# Patient Record
Sex: Male | Born: 1980 | Race: White | Hispanic: No | Marital: Single | Smoking: Never smoker
Health system: Southern US, Community
[De-identification: ages and names within clinical notes are randomized; demographics above are authoritative.]

## PROBLEM LIST (undated history)

## (undated) DIAGNOSIS — F4310 Post-traumatic stress disorder, unspecified: Secondary | ICD-10-CM

## (undated) DIAGNOSIS — F322 Major depressive disorder, single episode, severe without psychotic features: Secondary | ICD-10-CM

## (undated) HISTORY — PX: WISDOM TOOTH EXTRACTION: SHX21

---

## 2020-01-25 ENCOUNTER — Other Ambulatory Visit: Payer: Self-pay

## 2020-01-25 ENCOUNTER — Emergency Department
Admission: EM | Admit: 2020-01-25 | Discharge: 2020-01-26 | Disposition: A | Discharge status: Other Acute Inpatient Hospital | Payer: No Typology Code available for payment source | Attending: Student in an Organized Health Care Education/Training Program | Admitting: Student in an Organized Health Care Education/Training Program

## 2020-01-25 ENCOUNTER — Emergency Department: Payer: No Typology Code available for payment source

## 2020-01-25 DIAGNOSIS — S61512A Laceration without foreign body of left wrist, initial encounter: Secondary | ICD-10-CM

## 2020-01-25 DIAGNOSIS — F4312 Post-traumatic stress disorder, chronic: Secondary | ICD-10-CM | POA: Diagnosis present

## 2020-01-25 DIAGNOSIS — Y939 Activity, unspecified: Secondary | ICD-10-CM | POA: Diagnosis not present

## 2020-01-25 DIAGNOSIS — Z880 Allergy status to penicillin: Secondary | ICD-10-CM | POA: Diagnosis not present

## 2020-01-25 DIAGNOSIS — R45851 Suicidal ideations: Secondary | ICD-10-CM | POA: Insufficient documentation

## 2020-01-25 DIAGNOSIS — Y999 Unspecified external cause status: Secondary | ICD-10-CM | POA: Diagnosis not present

## 2020-01-25 DIAGNOSIS — Z23 Encounter for immunization: Secondary | ICD-10-CM | POA: Insufficient documentation

## 2020-01-25 DIAGNOSIS — Z20822 Contact with and (suspected) exposure to covid-19: Secondary | ICD-10-CM | POA: Diagnosis not present

## 2020-01-25 DIAGNOSIS — F329 Major depressive disorder, single episode, unspecified: Secondary | ICD-10-CM | POA: Diagnosis not present

## 2020-01-25 DIAGNOSIS — T1491XA Suicide attempt, initial encounter: Secondary | ICD-10-CM

## 2020-01-25 DIAGNOSIS — W269XXA Contact with unspecified sharp object(s), initial encounter: Secondary | ICD-10-CM | POA: Diagnosis not present

## 2020-01-25 DIAGNOSIS — Y929 Unspecified place or not applicable: Secondary | ICD-10-CM | POA: Insufficient documentation

## 2020-01-25 DIAGNOSIS — F332 Major depressive disorder, recurrent severe without psychotic features: Secondary | ICD-10-CM | POA: Diagnosis present

## 2020-01-25 HISTORY — DX: Major depressive disorder, single episode, severe without psychotic features: F32.2

## 2020-01-25 HISTORY — DX: Post-traumatic stress disorder, unspecified: F43.10

## 2020-01-25 LAB — CBC
HCT: 40.2 % (ref 39.0–52.0)
Hemoglobin: 13.6 g/dL (ref 13.0–17.0)
MCH: 30.4 pg (ref 26.0–34.0)
MCHC: 33.8 g/dL (ref 30.0–36.0)
MCV: 89.7 fL (ref 80.0–100.0)
Platelets: 281 10*3/uL (ref 150–400)
RBC: 4.48 MIL/uL (ref 4.22–5.81)
RDW: 12.4 % (ref 11.5–15.5)
WBC: 5.3 10*3/uL (ref 4.0–10.5)
nRBC: 0 % (ref 0.0–0.2)

## 2020-01-25 LAB — URINE DRUG SCREEN, QUALITATIVE (ARMC ONLY)
Amphetamines, Ur Screen: NOT DETECTED
Barbiturates, Ur Screen: NOT DETECTED
Benzodiazepine, Ur Scrn: NOT DETECTED
Cannabinoid 50 Ng, Ur ~~LOC~~: NOT DETECTED
Cocaine Metabolite,Ur ~~LOC~~: NOT DETECTED
MDMA (Ecstasy)Ur Screen: NOT DETECTED
Methadone Scn, Ur: NOT DETECTED
Opiate, Ur Screen: NOT DETECTED
Phencyclidine (PCP) Ur S: NOT DETECTED
Tricyclic, Ur Screen: NOT DETECTED

## 2020-01-25 LAB — COMPREHENSIVE METABOLIC PANEL
ALT: 19 U/L (ref 0–44)
AST: 24 U/L (ref 15–41)
Albumin: 4.4 g/dL (ref 3.5–5.0)
Alkaline Phosphatase: 87 U/L (ref 38–126)
Anion gap: 11 (ref 5–15)
BUN: 14 mg/dL (ref 6–20)
CO2: 24 mmol/L (ref 22–32)
Calcium: 9.1 mg/dL (ref 8.9–10.3)
Chloride: 105 mmol/L (ref 98–111)
Creatinine, Ser: 0.79 mg/dL (ref 0.61–1.24)
GFR calc Af Amer: >60 mL/min (ref >60)
GFR calc non Af Amer: >60 mL/min (ref >60)
Glucose, Bld: 122 mg/dL — ABNORMAL HIGH (ref 70–99)
Potassium: 3.6 mmol/L (ref 3.5–5.1)
Sodium: 140 mmol/L (ref 135–145)
Total Bilirubin: 1 mg/dL (ref 0.3–1.2)
Total Protein: 7.3 g/dL (ref 6.5–8.1)

## 2020-01-25 LAB — ACETAMINOPHEN LEVEL: Acetaminophen (Tylenol), Serum: <10 ug/mL — ABNORMAL LOW (ref 10–30)

## 2020-01-25 LAB — RESPIRATORY PANEL BY RT PCR (FLU A&B, COVID)
Influenza A by PCR: NEGATIVE
Influenza B by PCR: NEGATIVE
SARS Coronavirus 2 by RT PCR: NEGATIVE

## 2020-01-25 LAB — SALICYLATE LEVEL: Salicylate Lvl: <7 mg/dL — ABNORMAL LOW (ref 7.0–30.0)

## 2020-01-25 LAB — ETHANOL: Alcohol, Ethyl (B): 26 mg/dL — ABNORMAL HIGH (ref <10)

## 2020-01-25 MED ORDER — LIDOCAINE HCL (PF) 1 % IJ SOLN
5.0000 mL | Freq: Once | INTRAMUSCULAR | Status: AC
  Administered 2020-01-25: 22:00:00 5 mL via INTRADERMAL
  Filled 2020-01-25: qty 5

## 2020-01-25 MED ORDER — TETANUS-DIPHTH-ACELL PERTUSSIS 5-2.5-18.5 LF-MCG/0.5 IM SUSP
0.5000 mL | Freq: Once | INTRAMUSCULAR | Status: AC
  Administered 2020-01-25: 22:00:00 0.5 mL via INTRAMUSCULAR
  Filled 2020-01-25: qty 0.5

## 2020-01-25 NOTE — ED Notes (Signed)
Pt. Alert and oriented, warm and dry, in no distress. Pt. Denies SI, HI, and AVH. Pt has self afflicted LAC to left anterior wrist. Cut is 2 inches X 1 inch wide. Bleeding controled at this time.  Pt. Encouraged to let nursing staff know of any concerns or needs.

## 2020-01-25 NOTE — ED Triage Notes (Signed)
Pt to the er in custody of [place] [employer]. Pt states he is here for suicide attempt. Pt has a significant lac to the left anterior wrist that requires stitches. Pt was recently hospitalized at the [place]  From November to last Tuesday. Pt has a flat affect and attempted suicide on Saturday and when he was not successful, he researched better ways to succeed. Pt is monotone and has a flat affect but is very cooperative.

## 2020-01-25 NOTE — ED Provider Notes (Signed)
[facility] Emergency Department Provider Note    First MD Initiated Contact with Patient 01/25/20 2029     (approximate)  I have reviewed the triage vital signs and the nursing notes.   HISTORY  Chief Complaint SI   HPI Joseph Aguilar is a 39 y.o. male with a history of severe depression as well as PTSD presents to the ER for suicidal ideation as well as attempted suicide after cutting himself.  Patient was recently admitted to inpatient psychiatric facility did have some medication changes.  Feels like he is not sleeping.  Does endorse anhedonia.  Did admit to drinking alcohol today.    Past Medical History:  Diagnosis Date  . PTSD (post-traumatic stress disorder)   . Severe depression (HCC)    No family history on file. Past Surgical History:  Procedure Laterality Date  . WISDOM TOOTH EXTRACTION     There are no problems to display for this patient.     Prior to Admission medications   Not on File    Allergies Amoxicillin    Social History Social History   Tobacco Use  . Smoking status: Never Smoker  . Smokeless tobacco: Never Used  Substance Use Topics  . Alcohol use: Yes    Comment: social  . Drug use: Never    Review of Systems Patient denies headaches, rhinorrhea, blurry vision, numbness, shortness of breath, chest pain, edema, cough, abdominal pain, nausea, vomiting, diarrhea, dysuria, fevers, rashes or hallucinations unless otherwise stated above in HPI. ____________________________________________   PHYSICAL EXAM:  VITAL SIGNS: Vitals:   01/25/20 2000  BP: 128/80  Pulse: 89  Resp: 16  Temp: 98.6 F (37 C)  SpO2: 97%    Constitutional: Alert and oriented.  Eyes: Conjunctivae are normal.  Head: Atraumatic. Nose: No congestion/rhinnorhea. Mouth/Throat: Mucous membranes are moist.   Neck: No stridor. Painless ROM.  Cardiovascular: Normal rate, regular rhythm. Grossly normal heart sounds.  Good peripheral  circulation. Respiratory: Normal respiratory effort.  No retractions. Lungs CTAB. Gastrointestinal: Soft and nontender. No distention. No abdominal bruits. No CVA tenderness. Genitourinary:  Musculoskeletal: 5cm full thickness lac to left ulnar wrist.  extension and flexion mechanism of wrist and all five fingers intact.  N/V intact distally.  No foreign bodies. No lower extremity tenderness nor edema.  No joint effusions. Neurologic:  Normal speech and language. No gross focal neurologic deficits are appreciated. No facial droop Skin:  Skin is warm, dry and intact. No rash noted. Psychiatric: Mood and affect are normal. Speech and behavior are normal.  ____________________________________________   LABS (all labs ordered are listed, but only abnormal results are displayed)  Results for orders placed or performed during the hospital encounter of 01/25/20 (from the past 24 hour(s))  Comprehensive metabolic panel     Status: Abnormal   Collection Time: 01/25/20  8:35 PM  Result Value Ref Range   Sodium 140 135 - 145 mmol/L   Potassium 3.6 3.5 - 5.1 mmol/L   Chloride 105 98 - 111 mmol/L   CO2 24 22 - 32 mmol/L   Glucose, Bld 122 (H) 70 - 99 mg/dL   BUN 14 6 - 20 mg/dL   Creatinine, Ser 4.19 0.61 - 1.24 mg/dL   Calcium 9.1 8.9 - 62.2 mg/dL   Total Protein 7.3 6.5 - 8.1 g/dL   Albumin 4.4 3.5 - 5.0 g/dL   AST 24 15 - 41 U/L   ALT 19 0 - 44 U/L   Alkaline Phosphatase 87 38 -  126 U/L   Total Bilirubin 1.0 0.3 - 1.2 mg/dL   GFR calc non Af Amer >60 >60 mL/min   GFR calc Af Amer >60 >60 mL/min   Anion gap 11 5 - 15  Ethanol     Status: Abnormal   Collection Time: 01/25/20  8:35 PM  Result Value Ref Range   Alcohol, Ethyl (B) 26 (H) <10 mg/dL  Salicylate level     Status: Abnormal   Collection Time: 01/25/20  8:35 PM  Result Value Ref Range   Salicylate Lvl <7.0 (L) 7.0 - 30.0 mg/dL  Acetaminophen level     Status: Abnormal   Collection Time: 01/25/20  8:35 PM  Result Value Ref  Range   Acetaminophen (Tylenol), Serum <10 (L) 10 - 30 ug/mL  cbc     Status: None   Collection Time: 01/25/20  8:35 PM  Result Value Ref Range   WBC 5.3 4.0 - 10.5 K/uL   RBC 4.48 4.22 - 5.81 MIL/uL   Hemoglobin 13.6 13.0 - 17.0 g/dL   HCT 32.9 92.4 - 26.8 %   MCV 89.7 80.0 - 100.0 fL   MCH 30.4 26.0 - 34.0 pg   MCHC 33.8 30.0 - 36.0 g/dL   RDW 34.1 96.2 - 22.9 %   Platelets 281 150 - 400 K/uL   nRBC 0.0 0.0 - 0.2 %  Urine Drug Screen, Qualitative     Status: None   Collection Time: 01/25/20  8:35 PM  Result Value Ref Range   Tricyclic, Ur Screen NONE DETECTED NONE DETECTED   Amphetamines, Ur Screen NONE DETECTED NONE DETECTED   MDMA (Ecstasy)Ur Screen NONE DETECTED NONE DETECTED   Cocaine Metabolite,Ur [place] NONE DETECTED NONE DETECTED   Opiate, Ur Screen NONE DETECTED NONE DETECTED   Phencyclidine (PCP) Ur S NONE DETECTED NONE DETECTED   Cannabinoid 50 Ng, Ur [place] NONE DETECTED NONE DETECTED   Barbiturates, Ur Screen NONE DETECTED NONE DETECTED   Benzodiazepine, Ur Scrn NONE DETECTED NONE DETECTED   Methadone Scn, Ur NONE DETECTED NONE DETECTED  Respiratory Panel by RT PCR (Flu A&B, Covid) - Nasopharyngeal Swab     Status: None   Collection Time: 01/25/20  8:39 PM   Specimen: Nasopharyngeal Swab  Result Value Ref Range   SARS Coronavirus 2 by RT PCR NEGATIVE NEGATIVE   Influenza A by PCR NEGATIVE NEGATIVE   Influenza B by PCR NEGATIVE NEGATIVE   ____________________________________________ ____________________________________________  RADIOLOGY  I personally reviewed all radiographic images ordered to evaluate for the above acute complaints and reviewed radiology reports and findings.  These findings were personally discussed with the patient.  Please see medical record for radiology report.  ____________________________________________   PROCEDURES  Procedure(s) performed:  Marland KitchenMarland KitchenLaceration Repair  Date/Time: 01/25/2020 9:55 PM Performed by: Willy Eddy,  MD Authorized by: Willy Eddy, MD   Consent:    Consent obtained:  Verbal   Consent given by:  Patient   Risks discussed:  Infection, pain, retained foreign body, poor cosmetic result and poor wound healing Anesthesia (see MAR for exact dosages):    Anesthesia method:  Local infiltration   Local anesthetic:  Lidocaine 1% w/o epi Laceration details:    Location:  Shoulder/arm   Shoulder/arm location:  L lower arm   Length (cm):  5   Depth (mm):  5 Repair type:    Repair type:  Simple Exploration:    Hemostasis achieved with:  Direct pressure   Wound exploration: entire depth of wound probed and visualized  Contaminated: no   Treatment:    Area cleansed with:  Saline and Betadine   Amount of cleaning:  Extensive   Irrigation solution:  Sterile saline   Visualized foreign bodies/material removed: no   Skin repair:    Repair method:  Sutures   Suture size:  5-0   Suture material:  Prolene   Suture technique:  Simple interrupted and horizontal mattress   Number of sutures:  7 Approximation:    Approximation:  Close Post-procedure details:    Dressing:  Sterile dressing and antibiotic ointment   Patient tolerance of procedure:  Tolerated well, no immediate complications      Critical Care performed: no ____________________________________________   INITIAL IMPRESSION / ASSESSMENT AND PLAN / ED COURSE  Pertinent labs & imaging results that were available during my care of the patient were reviewed by me and considered in my medical decision making (see chart for details).   DDX: Psychosis, delirium, medication effect, noncompliance, polysubstance abuse, Si, Hi, depression   Joseph Aguilar is a 39 y.o. who presents to the ED with for evaluation of SI.  Patient has psych history of severe depression.  Laboratory testing was ordered to evaluation for underlying electrolyte derangement or signs of underlying organic pathology to explain today's presentation.  Based on  history and physical and laboratory evaluation, it appears that the patient's presentation is 2/2 underlying psychiatric disorder and will require further evaluation and management by inpatient psychiatry.  Patient was made an IVC due to attempted suicide.  Laceration repaired as above..  Disposition pending psychiatric evaluation.      The patient was evaluated in Emergency Department today for the symptoms described in the history of present illness. He/she was evaluated in the context of the global COVID-19 pandemic, which necessitated consideration that the patient might be at risk for infection with the SARS-CoV-2 virus that causes COVID-19. Institutional protocols and algorithms that pertain to the evaluation of patients at risk for COVID-19 are in a state of rapid change based on information released by regulatory bodies including the CDC and federal and state organizations. These policies and algorithms were followed during the patient's care in the ED.  As part of my medical decision making, I reviewed the following data within the [place] notes reviewed and incorporated, Labs reviewed, notes from prior ED visits and [place] Controlled Substance Database   ____________________________________________   FINAL CLINICAL IMPRESSION(S) / ED DIAGNOSES  Final diagnoses:  Attempted suicide ([place])  Laceration of left wrist, initial encounter      NEW MEDICATIONS STARTED DURING THIS VISIT:  New Prescriptions   No medications on file     Note:  This document was prepared using Dragon voice recognition software and may include unintentional dictation errors.    Merlyn Lot, MD 01/25/20 2157

## 2020-01-25 NOTE — ED Notes (Signed)
Pt belongings secured, underwear, shorts, shirt, sweater, socks, shoes, car keys, and wallet with credit cards and drivers license, no cash locked in locker.

## 2020-01-25 NOTE — ED Notes (Signed)
Patient's left wrist bandaged by this Clinical research associate.

## 2020-01-26 ENCOUNTER — Inpatient Hospital Stay
Admission: EM | Admit: 2020-01-26 | Discharge: 2020-01-28 | DRG: 885 | Disposition: A | Discharge status: Home / Self Care | Payer: No Typology Code available for payment source | Source: Intra-hospital | Attending: Psychiatry | Admitting: Psychiatry

## 2020-01-26 ENCOUNTER — Other Ambulatory Visit: Payer: Self-pay

## 2020-01-26 ENCOUNTER — Encounter: Payer: Self-pay | Admitting: Behavioral Health

## 2020-01-26 DIAGNOSIS — F332 Major depressive disorder, recurrent severe without psychotic features: Principal | ICD-10-CM | POA: Diagnosis present

## 2020-01-26 DIAGNOSIS — Z9182 Personal history of military deployment: Secondary | ICD-10-CM | POA: Diagnosis present

## 2020-01-26 DIAGNOSIS — Z23 Encounter for immunization: Secondary | ICD-10-CM | POA: Diagnosis not present

## 2020-01-26 DIAGNOSIS — Z79899 Other long term (current) drug therapy: Secondary | ICD-10-CM

## 2020-01-26 DIAGNOSIS — G479 Sleep disorder, unspecified: Secondary | ICD-10-CM | POA: Diagnosis present

## 2020-01-26 DIAGNOSIS — F329 Major depressive disorder, single episode, unspecified: Secondary | ICD-10-CM | POA: Diagnosis not present

## 2020-01-26 DIAGNOSIS — F4312 Post-traumatic stress disorder, chronic: Secondary | ICD-10-CM | POA: Diagnosis present

## 2020-01-26 DIAGNOSIS — T1491XA Suicide attempt, initial encounter: Secondary | ICD-10-CM | POA: Diagnosis present

## 2020-01-26 DIAGNOSIS — F4310 Post-traumatic stress disorder, unspecified: Secondary | ICD-10-CM | POA: Diagnosis present

## 2020-01-26 DIAGNOSIS — X789XXA Intentional self-harm by unspecified sharp object, initial encounter: Secondary | ICD-10-CM

## 2020-01-26 MED ORDER — ENSURE ENLIVE PO LIQD
237.0000 mL | Freq: Three times a day (TID) | ORAL | Status: DC
  Administered 2020-01-26 – 2020-01-27 (×5): 237 mL via ORAL

## 2020-01-26 MED ORDER — MAGNESIUM HYDROXIDE 400 MG/5ML PO SUSP: 30.0000 mL | Freq: Every day | ORAL | Status: DC | PRN

## 2020-01-26 MED ORDER — TRAZODONE HCL 100 MG PO TABS
100.0000 mg | ORAL_TABLET | Freq: Every day | ORAL | Status: DC
  Administered 2020-01-26: 100 mg via ORAL
  Filled 2020-01-26: qty 1

## 2020-01-26 MED ORDER — TRAZODONE HCL 100 MG PO TABS
100.0000 mg | ORAL_TABLET | Freq: Every day | ORAL | Status: DC
  Administered 2020-01-26 – 2020-01-27 (×2): 100 mg via ORAL
  Filled 2020-01-26 (×2): qty 1

## 2020-01-26 MED ORDER — ACETAMINOPHEN 325 MG PO TABS: 650.0000 mg | ORAL_TABLET | Freq: Four times a day (QID) | ORAL | Status: DC | PRN

## 2020-01-26 MED ORDER — ALUM & MAG HYDROXIDE-SIMETH 200-200-20 MG/5ML PO SUSP: 30.0000 mL | ORAL | Status: DC | PRN

## 2020-01-26 MED ORDER — INFLUENZA VAC SPLIT QUAD 0.5 ML IM SUSY
0.5000 mL | PREFILLED_SYRINGE | INTRAMUSCULAR | Status: AC
  Administered 2020-01-27: 0.5 mL via INTRAMUSCULAR
  Filled 2020-01-26: qty 0.5

## 2020-01-26 MED ORDER — VENLAFAXINE HCL ER 75 MG PO CP24
225.0000 mg | ORAL_CAPSULE | Freq: Every day | ORAL | Status: DC
  Administered 2020-01-27 – 2020-01-28 (×2): 225 mg via ORAL
  Filled 2020-01-26 (×2): qty 3

## 2020-01-26 MED ORDER — ADULT MULTIVITAMIN W/MINERALS CH
1.0000 | ORAL_TABLET | Freq: Every day | ORAL | Status: DC
  Administered 2020-01-27 – 2020-01-28 (×2): 1 via ORAL
  Filled 2020-01-26 (×2): qty 1

## 2020-01-26 MED ORDER — ARIPIPRAZOLE 10 MG PO TABS
10.0000 mg | ORAL_TABLET | Freq: Every day | ORAL | Status: DC
  Administered 2020-01-26 – 2020-01-28 (×3): 10 mg via ORAL
  Filled 2020-01-26 (×3): qty 1

## 2020-01-26 NOTE — ED Notes (Signed)
Patient is to be admitted to [facility] by Psychiatric Nurse Practitioner Gillermo Murdoch.  Attending Physician will be Dr. Toni Amend.   Patient has been assigned to room 309, by [facility] Charge Nurse [place].   Intake Paper Work has been signed and placed on patient chart.  ER staff is aware of the admission:  ER Secretary    Dr.  ER MD   Selena Batten Patient's Nurse   Patient Access.

## 2020-01-26 NOTE — [facility] Group Notes (Signed)
[facility] Group Notes:  (Nursing/MHT/Case Management/Adjunct)  Date:  01/26/2020  Time:  8:50 AM  Type of Therapy:  Community Group  Participation Level:  Active  Participation Quality:  Appropriate and Attentive  Affect:  Appropriate  Cognitive:  Alert and Appropriate  Insight:  Appropriate  Engagement in Group:  Engaged  Modes of Intervention:  Discussion, Education and Socialization  Summary of Progress/Problems:  Joseph Aguilar [name] 01/26/2020, 8:50 AM

## 2020-01-26 NOTE — Progress Notes (Signed)
Recreation Therapy Notes  INPATIENT RECREATION THERAPY ASSESSMENT  Patient Details Name: Joseph Aguilar MRN: [id number] DOB: June 14, 1981 Today's Date: 01/26/2020       Information Obtained From: Patient  Able to Participate in Assessment/Interview: Yes  Patient Presentation: Responsive  Reason for Admission (Per Patient): Active Symptoms, Suicidal Ideation, Suicide Attempt  Patient Stressors:    Coping Skills:   Deep Breathing, Other (Comment)(Walk)  Leisure Interests (2+):  (Loss of intrest in things since service)  Frequency of Recreation/Participation:    Awareness of Community Resources:  Yes  Community Resources:  [employer]  Current Use:    If no, Barriers?:    Expressed Interest in [employer] Information:    [place] of Residence:  Film/video editor  Patient Main Form of Transportation: Set designer  Patient Strengths:  Retail buyer, compassion  Patient Identified Areas of Improvement:  Being kind to myself  Patient Goal for Hospitalization:  Refocus on the plans already in place  Current SI (including self-harm):  Yes(No plan)  Current HI:  No  Current AVH: No  Staff Intervention Plan: Group Attendance, Collaborate with Interdisciplinary Treatment Team  Consent to Intern Participation: N/A  Krislyn Donnan 01/26/2020, 2:24 PM

## 2020-01-26 NOTE — Progress Notes (Signed)
Patient was admitted to the unit from [facility] unit at 0130, report received from [place], [place]. Patient was calm, cooperative and pleasant upon assessment. Patient endorses passive SI, but contracts for safety with this Clinical research associate. Patient denies HI/AVH, pain, anxiety. Patient skin check completed with [place], RN, no abnormalities other than the cut to his Left wrist from SI attempt. Wound is covered and has sutures in them. No contraband found on the patient or in his belongings. 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 Patient given education. Patient oriented to the unit and to his room. Patient given support and encouragement to be active in his treatment plan. Patient being monitored Q 15 minutes for safety per unit protocol. Patient remains safe on the unit.

## 2020-01-26 NOTE — Progress Notes (Signed)
Recreation Therapy Notes  Date: 01/26/2020  Time: 2:30 pm  Location: Outside  Behavioral response: Patient did not attend.   Group Type: Leisure   Participation level: N/A  Communication: Patient did not attend.   Comments: N/A  Talar Fraley LRT/CTRS        Briyan Kleven 01/26/2020 3:18 PM

## 2020-01-26 NOTE — Plan of Care (Signed)
Patient compliant with current medical issues  medication and laceration site  Improved medication information . Patient working on [employer] , decision making ,and anxiety  issues .  Continue thought of suicidal  ideations at present limited interaction with  peers Problem: Self-Concept: Goal: Will verbalize positive feelings about self Outcome: Progressing Goal: Level of anxiety will decrease Outcome: Progressing   Problem: Safety: Goal: Ability to disclose and discuss suicidal ideas will improve Outcome: Progressing Goal: Ability to identify and utilize support systems that promote safety will improve Outcome: Progressing   Problem: Role Relationship: Goal: Will demonstrate positive changes in social behaviors and relationships Outcome: Progressing   Problem: Coping: Goal: Coping ability will improve Outcome: Progressing Goal: Will verbalize feelings Outcome: Progressing   Problem: Activity: Goal: Interest or engagement in leisure activities will improve Outcome: Progressing Goal: Imbalance in normal sleep/wake cycle will improve Outcome: Progressing   Problem: Education: Goal: Utilization of techniques to improve thought processes will improve Outcome: Progressing Goal: Knowledge of the prescribed therapeutic regimen will improve Outcome: Progressing   Problem: Self-Concept: Goal: Ability to disclose and discuss suicidal ideas will improve Outcome: Progressing Goal: Will verbalize positive feelings about self Outcome: Progressing   Problem: Medication: Goal: Compliance with prescribed medication regimen will improve Outcome: Progressing   Problem: Medication: Goal: Compliance with prescribed medication regimen will improve Outcome: Progressing   Problem: Education: Goal: Ability to make informed decisions regarding treatment will improve Outcome: Progressing   Problem: [facility] Concurrent Medical Problem Goal: STG-Vital signs will be within defined limits or  stabilized Description: (STG- Vital signs will be within defined limits or stabilized for individual) Outcome: Progressing   Problem: [facility] Concurrent Medical Problem Goal: LTG-Pt will be physically stable and he/significant other Description: (Patient will be physically stable and he/significant other will be able to verbalize understanding of follow-up care and symptoms that would warrant further treatment) Outcome: Progressing Goal: STG-Vital signs will be within defined limits or stabilized Description: (STG- Vital signs will be within defined limits or stabilized for individual) Outcome: Progressing Goal: STG-Compliance with medication and/or treatment as ordered Description: (STG-Compliance with medication and/or treatment as ordered by MD) Outcome: Progressing Goal: STG-Verbalize two symptoms that would warrant further Description: (STG-Verbalize two symptoms that would warrant further treatment) Outcome: Progressing Goal: STG-Patient will participate in management/stabilization Description: (STG-Patient will participate in management/stabilization of medical condition) Outcome: Progressing

## 2020-01-26 NOTE — H&P (Signed)
Psychiatric Admission Assessment Adult  Patient Identification: Joseph Aguilar MRN:  [id number] Date of Evaluation:  01/26/2020 Chief Complaint:  MDD (major depressive disorder), recurrent episode, severe (HCC) [F33.2] Principal Diagnosis: MDD (major depressive disorder), recurrent episode, severe (HCC) Diagnosis:  Principal Problem:   MDD (major depressive disorder), recurrent episode, severe (HCC) Active Problems:   Self-inflicted laceration of left wrist (HCC)  History of Present Illness: Patient seen and chart reviewed.  39 year old man admitted to the psychiatric ward after presenting to the emergency room for self-inflicted laceration.  Patient states that he was at home and started feeling his depression more.  It had been the first day he had tried going back to work after his recent hospitalization.  He describes himself as being without any enjoyment of anything and feeling hopeless.  He goes to some length to state that he was not feeling "overwhelmed" and that it was not set of intense emotions that led to his cutting.  He cut himself it sounds like with a kitchen knife.  As soon as he saw that he had done he realized it would need medical attention and called 911 himself.  Patient had just been discharged from [facility] a week previously after a lengthy stay for treatment of depression.  He continues to experience chronic anhedonia, tiredness, hopelessness, somewhat impaired sleep at night and suicidal thoughts although denies any psychotic symptoms denies anything that would sound like mania.  Blood alcohol level was about 25 on admission suggesting he did have some alcohol to consume that evening but was not grossly intoxicated.  Patient plays down the role that alcohol plays in his mood symptoms. Associated Signs/Symptoms: Depression Symptoms:  depressed mood, anhedonia, fatigue, feelings of worthlessness/guilt, difficulty concentrating, recurrent thoughts of death, suicidal  attempt, loss of energy/fatigue, disturbed sleep, (Hypo) Manic Symptoms:  Impulsivity, Anxiety Symptoms:  Excessive Worry, Psychotic Symptoms:  None reported PTSD Symptoms: Had a traumatic exposure:  Patient evidently had been given a diagnosis of PTSD at the [facility] although he cannot really specify any specific trauma.  It sounds like he had a few startling experiences while he was serving in [employer] but it is not clear that he actually associates any of them with his chronic mood symptoms. Total Time spent with patient: 1 hour  Past Psychiatric History: Patient has been getting treatment for depression at least since 2015 when he was discharged from the [employer].  He says he thinks his symptoms went back longer than that.  He has been on multiple medications including several antidepressants as well as mood stabilizers including lithium and antipsychotics.  This was his only hospitalization recently at the [facility].  It was a planned hospitalization in November to initiate ECT treatment.  Patient says he received 6 ECT treatments and that after the last 1 he developed a seizure that took over 30 minutes to resolve.  Not clear whether that was worked up but in any case they discontinued ECT at that time.  He also received several ketamine treatments by protocol for treating depression but did not think that it was clearly beneficial for him.  He does have an outpatient provider through the [facility].  He is on Effexor trazodone hydroxyzine as needed and Abilify currently.  Is the patient at risk to self? Yes.    Has the patient been a risk to self in the past 6 months? Yes.    Has the patient been a risk to self within the distant past? Yes.  Is the patient a risk to others? No.  Has the patient been a risk to others in the past 6 months? No.  Has the patient been a risk to others within the distant past? No.   Prior Inpatient Therapy:   Prior Outpatient Therapy:    Alcohol Screening: 1. How often do you  have a drink containing alcohol?: Monthly or less 2. How many drinks containing alcohol do you have on a typical day when you are drinking?: 1 or 2 3. How often do you have six or more drinks on one occasion?: Never AUDIT-C Score: 1 4. How often during the last year have you found that you were not able to stop drinking once you had started?: Never 5. How often during the last year have you failed to do what was normally expected from you becasue of drinking?: Never 6. How often during the last year have you needed a first drink in the morning to get yourself going after a heavy drinking session?: Never 7. How often during the last year have you had a feeling of guilt of remorse after drinking?: Never 8. How often during the last year have you been unable to remember what happened the night before because you had been drinking?: Never 9. Have you or someone else been injured as a result of your drinking?: No 10. Has a relative or friend or a doctor or another health worker been concerned about your drinking or suggested you cut down?: No Alcohol Use Disorder Identification Test Final Score (AUDIT): 1 Alcohol Brief Interventions/Follow-up: AUDIT Score <7 follow-up not indicated Substance Abuse History in the last 12 months:  No. Consequences of Substance Abuse: Negative Previous Psychotropic Medications: Yes  Psychological Evaluations: Yes  Past Medical History:  Past Medical History:  Diagnosis Date  . PTSD (post-traumatic stress disorder)   . Severe depression (HCC)     Past Surgical History:  Procedure Laterality Date  . WISDOM TOOTH EXTRACTION     Family History: History reviewed. No pertinent family history. Family Psychiatric  History: Not aware of significant family history Tobacco Screening: Have you used any form of tobacco in the last 30 days? (Cigarettes, Smokeless Tobacco, Cigars, and/or Pipes): No Social History:  Social History   Substance and Sexual Activity  Alcohol  Use Yes   Comment: social     Social History   Substance and Sexual Activity  Drug Use Never    Additional Social History: Marital status: Single Are you sexually active?: No What is your sexual orientation?: Heterosexual Has your sexual activity been affected by drugs, alcohol, medication, or emotional stress?: Pt reports "by my emotional situation, my depression." Does patient have children?: No                         Allergies:   Allergies  Allergen Reactions  . Amoxicillin Rash   Lab Results:  Results for orders placed or performed during the hospital encounter of 01/25/20 (from the past 48 hour(s))  Comprehensive metabolic panel     Status: Abnormal   Collection Time: 01/25/20  8:35 PM  Result Value Ref Range   Sodium 140 135 - 145 mmol/L   Potassium 3.6 3.5 - 5.1 mmol/L   Chloride 105 98 - 111 mmol/L   CO2 24 22 - 32 mmol/L   Glucose, Bld 122 (H) 70 - 99 mg/dL   BUN 14 6 - 20 mg/dL   Creatinine, Ser 1.470.79 0.61 - 1.24 mg/dL  Calcium 9.1 8.9 - 10.3 mg/dL   Total Protein 7.3 6.5 - 8.1 g/dL   Albumin 4.4 3.5 - 5.0 g/dL   AST 24 15 - 41 U/L   ALT 19 0 - 44 U/L   Alkaline Phosphatase 87 38 - 126 U/L   Total Bilirubin 1.0 0.3 - 1.2 mg/dL   GFR calc non Af Amer >60 >60 mL/min   GFR calc Af Amer >60 >60 mL/min   Anion gap 11 5 - 15    Comment: Performed at [facility], [address] [place], [place] [address]  Ethanol     Status: Abnormal   Collection Time: 01/25/20  8:35 PM  Result Value Ref Range   Alcohol, Ethyl (B) 26 (H) <10 mg/dL    Comment: (NOTE) Lowest detectable limit for serum alcohol is 10 mg/dL. For medical purposes only. Performed at [facility], [address] [place], [place] [address]   Salicylate level     Status: Abnormal   Collection Time: 01/25/20  8:35 PM  Result Value Ref Range   Salicylate Lvl <7.0 (L) 7.0 - 30.0 mg/dL    Comment: Performed at [facility] [address] [place], [place]  [address]  Acetaminophen level     Status: Abnormal   Collection Time: 01/25/20  8:35 PM  Result Value Ref Range   Acetaminophen (Tylenol), Serum <10 (L) 10 - 30 ug/mL    Comment: (NOTE) Therapeutic concentrations vary significantly. A range of 10-30 ug/mL  may be an effective concentration for many patients. However, some  are best treated at concentrations outside of this range. Acetaminophen concentrations >150 ug/mL at 4 hours after ingestion  and >50 ug/mL at 12 hours after ingestion are often associated with  toxic reactions. Performed at [facility], [address] [place], [place] [address]   cbc     Status: None   Collection Time: 01/25/20  8:35 PM  Result Value Ref Range   WBC 5.3 4.0 - 10.5 K/uL   RBC 4.48 4.22 - 5.81 MIL/uL   Hemoglobin 13.6 13.0 - 17.0 g/dL   HCT 22.0 25.4 - 27.0 %   MCV 89.7 80.0 - 100.0 fL   MCH 30.4 26.0 - 34.0 pg   MCHC 33.8 30.0 - 36.0 g/dL   RDW 62.3 76.2 - 83.1 %   Platelets 281 150 - 400 K/uL   nRBC 0.0 0.0 - 0.2 %    Comment: Performed at [facility], [address], [place], [place] [address]  Urine Drug Screen, Qualitative     Status: None   Collection Time: 01/25/20  8:35 PM  Result Value Ref Range   Tricyclic, Ur Screen NONE DETECTED NONE DETECTED   Amphetamines, Ur Screen NONE DETECTED NONE DETECTED   MDMA (Ecstasy)Ur Screen NONE DETECTED NONE DETECTED   Cocaine Metabolite,Ur [place] NONE DETECTED NONE DETECTED   Opiate, Ur Screen NONE DETECTED NONE DETECTED   Phencyclidine (PCP) Ur S NONE DETECTED NONE DETECTED   Cannabinoid 50 Ng, Ur [place] NONE DETECTED NONE DETECTED   Barbiturates, Ur Screen NONE DETECTED NONE DETECTED   Benzodiazepine, Ur Scrn NONE DETECTED NONE DETECTED   Methadone Scn, Ur NONE DETECTED NONE DETECTED    Comment: (NOTE) Tricyclics + metabolites, urine    Cutoff 1000 ng/mL Amphetamines + metabolites, urine  Cutoff 1000 ng/mL MDMA (Ecstasy), urine              Cutoff 500 ng/mL Cocaine Metabolite,  urine  Cutoff 300 ng/mL Opiate + metabolites, urine        Cutoff 300 ng/mL Phencyclidine (PCP), urine         Cutoff 25 ng/mL Cannabinoid, urine                 Cutoff 50 ng/mL Barbiturates + metabolites, urine  Cutoff 200 ng/mL Benzodiazepine, urine              Cutoff 200 ng/mL Methadone, urine                   Cutoff 300 ng/mL The urine drug screen provides only a preliminary, unconfirmed analytical test result and should not be used for non-medical purposes. Clinical consideration and professional judgment should be applied to any positive drug screen result due to possible interfering substances. A more specific alternate chemical method must be used in order to obtain a confirmed analytical result. Gas chromatography / mass spectrometry (GC/MS) is the preferred confirmat ory method. Performed at [facility], [address] [place], [place] [address]   Respiratory Panel by RT PCR (Flu A&B, Covid) - Nasopharyngeal Swab     Status: None   Collection Time: 01/25/20  8:39 PM   Specimen: Nasopharyngeal Swab  Result Value Ref Range   SARS Coronavirus 2 by RT PCR NEGATIVE NEGATIVE    Comment: (NOTE) SARS-CoV-2 target nucleic acids are NOT DETECTED. The SARS-CoV-2 RNA is generally detectable in upper respiratoy specimens during the acute phase of infection. The lowest concentration of SARS-CoV-2 viral copies this assay can detect is 131 copies/mL. A negative result does not preclude SARS-Cov-2 infection and should not be used as the sole basis for treatment or other patient management decisions. A negative result may occur with  improper specimen collection/handling, submission of specimen other than nasopharyngeal swab, presence of viral mutation(s) within the areas targeted by this assay, and inadequate number of viral copies (<131 copies/mL). A negative result must be combined with clinical observations, patient history, and epidemiological information.  The expected result is Negative. Fact Sheet for Patients:  https://www.moore.com/ Fact Sheet for Healthcare Providers:  https://www.young.biz/ This test is not yet ap proved or cleared by the [place] FDA and  has been authorized for detection and/or diagnosis of SARS-CoV-2 by FDA under an Emergency Use Authorization (EUA). This EUA will remain  in effect (meaning this test can be used) for the duration of the COVID-19 declaration under Section 564(b)(1) of the Act, 21 U.S.C. section 360bbb-3(b)(1), unless the authorization is terminated or revoked sooner.    Influenza A by PCR NEGATIVE NEGATIVE   Influenza B by PCR NEGATIVE NEGATIVE    Comment: (NOTE) The Xpert Xpress SARS-CoV-2/FLU/RSV assay is intended as an aid in  the diagnosis of influenza from Nasopharyngeal swab specimens and  should not be used as a sole basis for treatment. Nasal washings and  aspirates are unacceptable for Xpert Xpress SARS-CoV-2/FLU/RSV  testing. Fact Sheet for Patients: https://www.moore.com/ Fact Sheet for Healthcare Providers: https://www.young.biz/ This test is not yet approved or cleared by the [place] FDA and  has been authorized for detection and/or diagnosis of SARS-CoV-2 by  FDA under an Emergency Use Authorization (EUA). This EUA will remain  in effect (meaning this test can be used) for the duration of the  Covid-19 declaration under Section 564(b)(1) of the Act, 21  U.S.C. section 360bbb-3(b)(1), unless the authorization is  terminated or revoked. Performed at [facility], [address], [place], [place] [address]  Blood Alcohol level:  Lab Results  Component Value Date   ETH 26 (H) 01/25/2020    Metabolic Disorder Labs:  No results found for: HGBA1C, MPG No results found for: PROLACTIN No results found for: CHOL, TRIG, HDL, CHOLHDL, VLDL, LDLCALC  Current Medications: Current  Facility-Administered Medications  Medication Dose Route Frequency Provider Last Rate Last Admin  . acetaminophen (TYLENOL) tablet 650 mg  650 mg Oral Q6H PRN Gillermo Murdoch, NP      . alum & mag hydroxide-simeth (MAALOX/MYLANTA) 200-200-20 MG/5ML suspension 30 mL  30 mL Oral Q4H PRN Gillermo Murdoch, NP      . ARIPiprazole (ABILIFY) tablet 10 mg  10 mg Oral Daily Starlene Consuegra, Jackquline Denmark, MD   10 mg at 01/26/20 0939  . feeding supplement (ENSURE ENLIVE) (ENSURE ENLIVE) liquid 237 mL  237 mL Oral TID BM Emberleigh Reily T, MD      . Melene Muller ON 01/27/2020] influenza vac split quadrivalent PF (FLUARIX) injection 0.5 mL  0.5 mL Intramuscular Tomorrow-1000 Taishawn Smaldone T, MD      . magnesium hydroxide (MILK OF MAGNESIA) suspension 30 mL  30 mL Oral Daily PRN Gillermo Murdoch, NP      . Melene Muller ON 01/27/2020] multivitamin with minerals tablet 1 tablet  1 tablet Oral Daily Lando Alcalde T, MD      . traZODone (DESYREL) tablet 100 mg  100 mg Oral QHS Gillermo Murdoch, NP      . Melene Muller ON 01/27/2020] venlafaxine XR (EFFEXOR-XR) 24 hr capsule 225 mg  225 mg Oral Q breakfast Melonie Germani T, MD       PTA Medications: No medications prior to admission.    Musculoskeletal: Strength & Muscle Tone: within normal limits Gait & Station: normal Patient leans: N/A  Psychiatric Specialty Exam: Physical Exam  Nursing note and vitals reviewed. Constitutional: He appears well-developed and well-nourished.  HENT:  Head: Normocephalic and atraumatic.  Eyes: Pupils are equal, round, and reactive to light. Conjunctivae are normal.  Cardiovascular: Normal heart sounds.  Respiratory: Effort normal.  GI: Soft.  Musculoskeletal:        General: Normal range of motion.     Cervical back: Normal range of motion.  Neurological: He is alert.  Skin: Skin is warm and dry.     Psychiatric: Judgment normal. His affect is blunt. His speech is delayed. He is slowed and withdrawn. Cognition and memory are normal. He  exhibits a depressed mood. He expresses suicidal ideation. He expresses no suicidal plans.    Review of Systems  Constitutional: Negative.   HENT: Negative.   Eyes: Negative.   Respiratory: Negative.   Cardiovascular: Negative.   Gastrointestinal: Negative.   Musculoskeletal: Negative.   Skin: Negative.   Neurological: Negative.   Psychiatric/Behavioral: Positive for dysphoric mood and self-injury. The patient is nervous/anxious.     Blood pressure (!) 141/98, pulse 60, temperature 97.9 F (36.6 C), temperature source Oral, resp. rate 16, height 6\' 1"  (1.854 m), weight 64 kg, SpO2 100 %.Body mass index is 18.6 kg/m.  General Appearance: Casual  Eye Contact:  Good  Speech:  Clear and Coherent  Volume:  Normal  Mood:  Euthymic  Affect:  Congruent  Thought Process:  Goal Directed  Orientation:  Full (Time, Place, and Person)  Thought Content:  Logical  Suicidal Thoughts:  Yes.  without intent/plan  Homicidal Thoughts:  No  Memory:  Immediate;   Fair Recent;   Fair Remote;   Fair  Judgement:  Fair  Insight:  Fair  Psychomotor Activity:  Decreased  Concentration:  Concentration: Fair  Recall:  [employer] of Knowledge:  Fair  Language:  Fair  Akathisia:  No  Handed:  Right  AIMS (if indicated):     Assets:  Desire for Improvement Housing Physical Health Resilience  ADL's:  Intact  Cognition:  WNL  Sleep:       Treatment Plan Summary: Daily contact with patient to assess and evaluate symptoms and progress in treatment, Medication management and Plan Patient seen and chart reviewed.  39 year old man with what appears to be severe recurrent major depression that has been resistant to multiple efforts at appropriate treatment.  Not currently psychotic.  No evidence of bipolar disorder.  Patient is cooperative right now but still with multiple depressive symptoms.  Patient is at high risk of suicide with multiple risk factors although right now he is not expressing any desire  to kill himself.  15-minute checks can be maintained on the unit.  I have restarted his venlafaxine and aripiprazole and trazodone as they were previously.  Labs have been reviewed.  I am making an effort to get in touch with his outpatient psychiatrist through the [facility] but so far have not been able to get through.  Engage in individual and group therapy.  Treatment team tomorrow morning.  Observation Level/Precautions:  15 minute checks  Laboratory:  Chemistry Profile  Psychotherapy:    Medications:    Consultations:    Discharge Concerns:    Estimated LOS:  Other:     Physician Treatment Plan for Primary Diagnosis: MDD (major depressive disorder), recurrent episode, severe ([place]) Long Term Goal(s): Improvement in symptoms so as ready for discharge  Short Term Goals: Ability to verbalize feelings will improve, Ability to disclose and discuss suicidal ideas and Ability to demonstrate self-control will improve  Physician Treatment Plan for Secondary Diagnosis: Principal Problem:   MDD (major depressive disorder), recurrent episode, severe ([place]) Active Problems:   Self-inflicted laceration of left wrist ([place])  Long Term Goal(s): Improvement in symptoms so as ready for discharge  Short Term Goals: Ability to identify and develop effective coping behaviors will improve and Ability to maintain clinical measurements within normal limits will improve  I certify that inpatient services furnished can reasonably be expected to improve the patient's condition.    Alethia Berthold, MD 2/17/20211:41 PM

## 2020-01-26 NOTE — BH Assessment (Signed)
Assessment Note  Joseph Aguilar is an 39 y.o. male. Accompanied by [facility] PD. Pt states he is here for suicide attempt. Pt has a significant lac to the left anterior wrist that requires stitches. Pt states that he also attempted to kill himself on Saturday. He describes this event as minor. He state that he call 911 because he began to clot and realized that " I didn't do a good job." Pt was recently hospitalized at the [place]  From November to last Tuesday.  He shares that today was his first day back at work and that he felt confused and overwhelmed. Pt. Denies any active SI/HI. The patient does not appear to be responding to internal or external stimuli. Neither is the patient presenting with any delusional thinking. Pt. Did endorsed the presence of any auditory or visual hallucinations at this time. Patient reports that this sx began after beginning ECT therapy and recent adjustments to his medications. He denied any illicit drug use and reports occasional alcohol use.    Diagnosis: Major Depressive Disorder   Past Medical History:  Past Medical History:  Diagnosis Date  . PTSD (post-traumatic stress disorder)   . Severe depression (HCC)     Past Surgical History:  Procedure Laterality Date  . WISDOM TOOTH EXTRACTION      Family History: No family history on file.  Social History:  reports that he has never smoked. He has never used smokeless tobacco. He reports current alcohol use. He reports that he does not use drugs.  Additional Social History:  Alcohol / Drug Use Pain Medications: SEE MAR Prescriptions: SEE MAR Over the Counter: SEE MAR History of alcohol / drug use?: Yes Substance #1 Name of Substance 1: Alcohol 1 - Age of First Use: 19 1 - Amount (size/oz): 1-2 Drinks 1 - Frequency: 3 times per week 1 - Duration: ongoing 1 - Last Use / Amount: PTA  CIWA: CIWA-Ar BP: 128/80 Pulse Rate: 89 COWS:    Allergies:  Allergies  Allergen Reactions  . Amoxicillin Rash    Home  Medications: (Not in a hospital admission)   OB/GYN Status:  No LMP for male patient.  General Assessment Data Location of Assessment: [facility] ED TTS Assessment: In system Is this a Tele or Face-to-Face Assessment?: Tele Assessment Is this an Initial Assessment or a Re-assessment for this encounter?: Initial Assessment Language Other than English: No Living Arrangements: Other (Comment) What gender do you identify as?: Male Marital status: Single Living Arrangements: Alone Can pt return to current living arrangement?: Yes Admission Status: Involuntary Petitioner: Other Is patient capable of signing voluntary admission?: No Referral Source: Self/Family/Friend Insurance type: Cigna  Medical Screening Exam [facility] Walk-in ONLY) Medical Exam completed: Yes  Crisis Care Plan Living Arrangements: Alone Legal Guardian: Other:(none) [facility] Therapist: [facility]  Education Status Is patient currently in school?: No Is the patient employed, unemployed or receiving disability?: Employed  Risk to self with the past 6 months Suicidal Ideation: No-Not Currently/Within Last 6 Months Has patient been a risk to self within the past 6 months prior to admission? : Yes Suicidal Intent: Yes-Currently Present Has patient had any suicidal intent within the past 6 months prior to admission? : Yes Is patient at risk for suicide?: Yes Suicidal Plan?: Yes-Currently Present Has patient had any suicidal plan within the past 6 months prior to admission? : Yes Specify Current Suicidal Plan: Cut wrist Access to Means: Yes What has been your use of drugs/alcohol within the  last 12 months?: alcohol use Previous Attempts/Gestures: Yes How many times?: 2 Triggers for Past Attempts: Unpredictable Intentional Self Injurious Behavior: Cutting Family Suicide History: No Recent stressful life event(s): Turmoil (Comment) Persecutory voices/beliefs?: No Depression: Yes Depression Symptoms:  Fatigue, Feeling angry/irritable, Feeling worthless/self pity, Despondent Substance abuse history and/or treatment for substance abuse?: No Suicide prevention information given to non-admitted patients: Not applicable  Risk to Others within the past 6 months Homicidal Ideation: No Does patient have any lifetime risk of violence toward others beyond the six months prior to admission? : No Thoughts of Harm to Others: No Current Homicidal Intent: No Current Homicidal Plan: No Access to Homicidal Means: No Identified Victim: none History of harm to others?: No Assessment of Violence: None Noted Violent Behavior Description: No Does patient have access to weapons?: No Criminal Charges Pending?: No Does patient have a court date: No Is patient on probation?: No  Psychosis Hallucinations: Auditory, Visual Delusions: None noted  Mental Status Report Appearance/Hygiene: In scrubs Eye Contact: Fair Motor Activity: Freedom of movement Speech: Logical/coherent Level of Consciousness: Alert Mood: Anxious, Depressed Affect: Anxious Anxiety Level: Minimal Thought Processes: Relevant Judgement: Partial Orientation: Time, Place, Person, Situation Obsessive Compulsive Thoughts/Behaviors: None  Cognitive Functioning Concentration: Good Memory: Remote Intact, Recent Intact Is patient IDD: No Insight: Fair Impulse Control: Poor Appetite: Fair Have you had any weight changes? : No Change Sleep: No Change Total Hours of Sleep: 8 Vegetative Symptoms: None  ADLScreening [facility] Assessment Services) Patient's cognitive ability adequate to safely complete daily activities?: Yes Patient able to express need for assistance with ADLs?: Yes Independently performs ADLs?: Yes (appropriate for developmental age)  Prior Inpatient Therapy Prior Inpatient Therapy: Yes Prior Therapy Dates: 11/20-2/21 [facility] Reason for Treatment: SI  Prior Outpatient Therapy Prior  Outpatient Therapy: Yes Prior Therapy Dates: Current  [facility] Reason for Treatment: Depression, PTSD Does patient have an ACCT team?: No Does patient have Intensive In-House Services?  : No Does patient have [facility] services? : No Does patient have P4CC services?: No  ADL Screening (condition at time of admission) Patient's cognitive ability adequate to safely complete daily activities?: Yes Patient able to express need for assistance with ADLs?: Yes Independently performs ADLs?: Yes (appropriate for developmental age)       Abuse/Neglect Assessment (Assessment to be complete while patient is alone) Abuse/Neglect Assessment Can Be Completed: Yes Physical Abuse: Denies Verbal Abuse: Denies Sexual Abuse: Denies Exploitation of patient/patient's resources: Denies Values / Beliefs Cultural Requests During Hospitalization: None Spiritual Requests During Hospitalization: None Consults Spiritual Care Consult Needed: No Transition of Care Team Consult Needed: No Advance Directives (For Healthcare) Does Patient Have a Medical Advance Directive?: No          Disposition:  Disposition Initial Assessment Completed for this Encounter: Yes Disposition of Patient: Admit  On Site Evaluation by:   Reviewed with Physician:    Laretta Alstrom 01/26/2020 1:11 AM

## 2020-01-26 NOTE — Plan of Care (Signed)
  Problem: Medication: Goal: Compliance with prescribed medication regimen will improve Outcome: Progressing   Problem: Safety: Goal: Ability to disclose and discuss suicidal ideas will improve Outcome: Progressing Goal: Ability to identify and utilize support systems that promote safety will improve Outcome: Progressing

## 2020-01-26 NOTE — Consult Note (Signed)
[facility] Face-to-Face Psychiatry Consult   Reason for Consult: Suicide attempt Referring Physician: Dr. 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  I just started to cut my wrist." Joseph Aguilar is a 39 y.o. male patient presented to [facility] ED via law enforcement under involuntary commitment status (IVC).  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  Per the ED triage nursing note, the patient came to the ED for a suicide attempt.  He has a significant laceration to his left anterior wrist that required stitches.  The patient was hospitalized at the [facility] for 2-1/2 months and was discharged last Tuesday.  He presents with a flat affect, and since his discharge, this has been his second suicide attempt.  He reported, he attempted suicide on Saturday, and when he was not successful, he researched a better ways to succeed.  The patient explained he return to work today and voiced that he felt unprepared and not well equipped to handle his first day at work.  He discussed that he received six bouts of ECT therapy during his 2-1/2 months admission at the [place]. He expressed the sixth ECT therapy; he had a seizure that lasted for 37 minutes.  He discussed that after that, they were unable to continue the treatment.  He voiced that he was given ketamine, which did not work for him.  He feels that the ketamine is causing some residual side effects such as sensory distortion, and auditory hallucination, more like a whisper.  The patient disclosed that his doctors have been talking about him trying TMS treatment, but he will have to go to [place],  [place].  He voiced he is still thinking about that distance and is unsure what he is going to do. He is currently prescribed Abilify, Effexor and Trazodone.  The patient was seen face-to-face by this provider; chart reviewed and consulted with Dr. Roxan Hockey on 01/25/2020 due to the patient's care. It was discussed with the EDP that the patient does meet the criteria to be admitted to the psychiatric inpatient unit.  The patient is alert and oriented x 4, calm, cooperative, and mood-congruent with flat affect on evaluation. The patient does not appear to be responding to internal or external stimuli. Neither is the patient presenting with any delusional thinking. The patient admits to auditory hallucinations but denies visual hallucinations. The patient denies suicidal, homicidal, or self-harm ideations.  He voiced that when he attempted suicide today (02.16.21), the patient was not overwhelmed, and he used his coping skill after he had injured himself.  [facility] and disclose what he had done. The patient is not presenting with any psychotic or paranoid behaviors. During an encounter with the patient, he was able to answer questions appropriately.  Plan: The patient is a safety risk to self and does require psychiatric inpatient admission for stabilization and treatment.  HPI: Per Dr. Roxan Hockey; Joseph Aguilar is a 39 y.o. male with a history of severe depression as well as PTSD presents to the ER for suicidal ideation as well as attempted suicide after cutting himself.  Patient was recently admitted to inpatient psychiatric facility did have some medication  changes.  Feels like he is not sleeping.  Does endorse anhedonia.  Did admit to drinking alcohol today.  Past Psychiatric History:  PTSD (post traumatic stress disorder) Severe depression (HCC)  Risk to Self:  Yes Risk to Others:  No Prior Inpatient Therapy:  Yes Prior Outpatient Therapy:  Yes  Past Medical History:   Past Medical History:  Diagnosis Date  . PTSD (post-traumatic stress disorder)   . Severe depression (HCC)     Past Surgical History:  Procedure Laterality Date  . WISDOM TOOTH EXTRACTION     Family History: No family history on file. Family Psychiatric  History:  Social History:  Social History   Substance and Sexual Activity  Alcohol Use Yes   Comment: social     Social History   Substance and Sexual Activity  Drug Use Never    Social History   Socioeconomic History  . Marital status: Single    Spouse name: Not on file  . Number of children: Not on file  . Years of education: Not on file  . Highest education level: Not on file  Occupational History  . Not on file  Tobacco Use  . Smoking status: Never Smoker  . Smokeless tobacco: Never Used  Substance and Sexual Activity  . Alcohol use: Yes    Comment: social  . Drug use: Never  . Sexual activity: Not on file  Other Topics Concern  . Not on file  Social History Narrative  . Not on file   Social Determinants of Health   Financial Resource Strain:   . Difficulty of Paying Living Expenses: Not on file  Food Insecurity:   . Worried About Programme researcher, broadcasting/film/video in the Last Year: Not on file  . Ran Out of Food in the Last Year: Not on file  Transportation Needs:   . Lack of Transportation (Medical): Not on file  . Lack of Transportation (Non-Medical): Not on file  Physical Activity:   . Days of Exercise per Week: Not on file  . Minutes of Exercise per Session: Not on file  Stress:   . Feeling of Stress : Not on file  Social Connections:   . Frequency of Communication with Friends and Family: Not on file  . Frequency of Social Gatherings with Friends and Family: Not on file  . Attends Religious Services: Not on file  . Active Member of Clubs or Organizations: Not on file  . Attends Banker Meetings: Not on file  . Marital Status: Not on file   Additional Social History:    Allergies:    Allergies  Allergen Reactions  . Amoxicillin Rash    Labs:  Results for orders placed or performed during the hospital encounter of 01/25/20 (from the past 48 hour(s))  Comprehensive metabolic panel     Status: Abnormal   Collection Time: 01/25/20  8:35 PM  Result Value Ref Range   Sodium 140 135 - 145 mmol/L   Potassium 3.6 3.5 - 5.1 mmol/L   Chloride 105 98 - 111 mmol/L   CO2 24 22 - 32 mmol/L   Glucose, Bld 122 (H) 70 - 99 mg/dL   BUN 14 6 - 20 mg/dL   Creatinine, Ser 0.27 0.61 - 1.24 mg/dL   Calcium 9.1 8.9 - 25.3 mg/dL   Total Protein 7.3 6.5 - 8.1 g/dL   Albumin 4.4 3.5 - 5.0 g/dL   AST 24 15 - 41 U/L   ALT 19 0 - 44 U/L  Alkaline Phosphatase 87 38 - 126 U/L   Total Bilirubin 1.0 0.3 - 1.2 mg/dL   GFR calc non Af Amer >60 >60 mL/min   GFR calc Af Amer >60 >60 mL/min   Anion gap 11 5 - 15    Comment: Performed at [facility], [address] [place], [place] [address]  Ethanol     Status: Abnormal   Collection Time: 01/25/20  8:35 PM  Result Value Ref Range   Alcohol, Ethyl (B) 26 (H) <10 mg/dL    Comment: (NOTE) Lowest detectable limit for serum alcohol is 10 mg/dL. For medical purposes only. Performed at [facility], [address] [place], [place] [address]   Salicylate level     Status: Abnormal   Collection Time: 01/25/20  8:35 PM  Result Value Ref Range   Salicylate Lvl <7.0 (L) 7.0 - 30.0 mg/dL    Comment: Performed at [facility], [address] [place], [place] [address]  Acetaminophen level     Status: Abnormal   Collection Time: 01/25/20  8:35 PM  Result Value Ref Range   Acetaminophen (Tylenol), Serum <10 (L) 10 - 30 ug/mL    Comment: (NOTE) Therapeutic concentrations vary significantly. A range of 10-30 ug/mL  may be an effective concentration for many patients. However, some  are best treated at concentrations outside of this range. Acetaminophen concentrations >150 ug/mL at 4 hours after ingestion  and >50  ug/mL at 12 hours after ingestion are often associated with  toxic reactions. Performed at [facility], [address] [place], [place] [address]   cbc     Status: None   Collection Time: 01/25/20  8:35 PM  Result Value Ref Range   WBC 5.3 4.0 - 10.5 K/uL   RBC 4.48 4.22 - 5.81 MIL/uL   Hemoglobin 13.6 13.0 - 17.0 g/dL   HCT 41.9 62.2 - 29.7 %   MCV 89.7 80.0 - 100.0 fL   MCH 30.4 26.0 - 34.0 pg   MCHC 33.8 30.0 - 36.0 g/dL   RDW 98.9 21.1 - 94.1 %   Platelets 281 150 - 400 K/uL   nRBC 0.0 0.0 - 0.2 %    Comment: Performed at [facility], [address], [place], [place] [address]  Urine Drug Screen, Qualitative     Status: None   Collection Time: 01/25/20  8:35 PM  Result Value Ref Range   Tricyclic, Ur Screen NONE DETECTED NONE DETECTED   Amphetamines, Ur Screen NONE DETECTED NONE DETECTED   MDMA (Ecstasy)Ur Screen NONE DETECTED NONE DETECTED   Cocaine Metabolite,Ur [place] NONE DETECTED NONE DETECTED   Opiate, Ur Screen NONE DETECTED NONE DETECTED   Phencyclidine (PCP) Ur S NONE DETECTED NONE DETECTED   Cannabinoid 50 Ng, Ur [place] NONE DETECTED NONE DETECTED   Barbiturates, Ur Screen NONE DETECTED NONE DETECTED   Benzodiazepine, Ur Scrn NONE DETECTED NONE DETECTED   Methadone Scn, Ur NONE DETECTED NONE DETECTED    Comment: (NOTE) Tricyclics + metabolites, urine    Cutoff 1000 ng/mL Amphetamines + metabolites, urine  Cutoff 1000 ng/mL MDMA (Ecstasy), urine              Cutoff 500 ng/mL Cocaine Metabolite, urine          Cutoff 300 ng/mL Opiate + metabolites, urine        Cutoff 300 ng/mL Phencyclidine (PCP), urine         Cutoff 25 ng/mL Cannabinoid, urine  Cutoff 50 ng/mL Barbiturates + metabolites, urine  Cutoff 200 ng/mL Benzodiazepine, urine              Cutoff 200 ng/mL Methadone, urine                   Cutoff 300 ng/mL The urine drug screen provides only a preliminary, unconfirmed analytical test result and should not be used for  non-medical purposes. Clinical consideration and professional judgment should be applied to any positive drug screen result due to possible interfering substances. A more specific alternate chemical method must be used in order to obtain a confirmed analytical result. Gas chromatography / mass spectrometry (GC/MS) is the preferred confirmat ory method. Performed at [facility], [address] [place], [place] [address]   Respiratory Panel by RT PCR (Flu A&B, Covid) - Nasopharyngeal Swab     Status: None   Collection Time: 01/25/20  8:39 PM   Specimen: Nasopharyngeal Swab  Result Value Ref Range   SARS Coronavirus 2 by RT PCR NEGATIVE NEGATIVE    Comment: (NOTE) SARS-CoV-2 target nucleic acids are NOT DETECTED. The SARS-CoV-2 RNA is generally detectable in upper respiratoy specimens during the acute phase of infection. The lowest concentration of SARS-CoV-2 viral copies this assay can detect is 131 copies/mL. A negative result does not preclude SARS-Cov-2 infection and should not be used as the sole basis for treatment or other patient management decisions. A negative result may occur with  improper specimen collection/handling, submission of specimen other than nasopharyngeal swab, presence of viral mutation(s) within the areas targeted by this assay, and inadequate number of viral copies (<131 copies/mL). A negative result must be combined with clinical observations, patient history, and epidemiological information. The expected result is Negative. Fact Sheet for Patients:  PinkCheek.be Fact Sheet for Healthcare Providers:  GravelBags.it This test is not yet ap proved or cleared by the [place] FDA and  has been authorized for detection and/or diagnosis of SARS-CoV-2 by FDA under an Emergency Use Authorization (EUA). This EUA will remain  in effect (meaning this test can be used) for the duration of  the COVID-19 declaration under Section 564(b)(1) of the Act, 21 U.S.C. section 360bbb-3(b)(1), unless the authorization is terminated or revoked sooner.    Influenza A by PCR NEGATIVE NEGATIVE   Influenza B by PCR NEGATIVE NEGATIVE    Comment: (NOTE) The Xpert Xpress SARS-CoV-2/FLU/RSV assay is intended as an aid in  the diagnosis of influenza from Nasopharyngeal swab specimens and  should not be used as a sole basis for treatment. Nasal washings and  aspirates are unacceptable for Xpert Xpress SARS-CoV-2/FLU/RSV  testing. Fact Sheet for Patients: PinkCheek.be Fact Sheet for Healthcare Providers: GravelBags.it This test is not yet approved or cleared by the [place] FDA and  has been authorized for detection and/or diagnosis of SARS-CoV-2 by  FDA under an Emergency Use Authorization (EUA). This EUA will remain  in effect (meaning this test can be used) for the duration of the  Covid-19 declaration under Section 564(b)(1) of the Act, 21  U.S.C. section 360bbb-3(b)(1), unless the authorization is  terminated or revoked. Performed at [facility], [address], [place], [place] [address]     Current Facility-Administered Medications  Medication Dose Route Frequency Provider Last Rate Last Admin  . traZODone (DESYREL) tablet 100 mg  100 mg Oral QHS Hinda Kehr, MD   100 mg at 01/26/20 0020   No current outpatient medications on file.    Musculoskeletal:  Strength & Muscle Tone: within normal limits Gait & Station: normal Patient leans: N/A  Psychiatric Specialty Exam: Physical Exam  Nursing note and vitals reviewed. Constitutional: He is oriented to person, place, and time. He appears well-developed.  Cardiovascular: Normal rate.  Respiratory: Effort normal.  Musculoskeletal:     Cervical back: Normal range of motion and neck supple.  Neurological: He is alert and oriented to person, place, and  time.    Review of Systems  Psychiatric/Behavioral: Positive for self-injury and suicidal ideas. The patient is nervous/anxious.   All other systems reviewed and are negative.   Blood pressure 128/80, pulse 89, temperature 98.6 F (37 C), temperature source Oral, resp. rate 16, height 6\' 1"  (1.854 m), weight 64.4 kg, SpO2 97 %.Body mass index is 18.73 kg/m.  General Appearance: Casual  Eye Contact:  Good  Speech:  Clear and Coherent  Volume:  Decreased  Mood:  Depressed and Hopeless  Affect:  Congruent, Depressed and Flat  Thought Process:  Coherent  Orientation:  Full (Time, Place, and Person)  Thought Content:  WDL and Logical  Suicidal Thoughts:  No  Homicidal Thoughts:  No  Memory:  Immediate;   Good Recent;   Good Remote;   Good  Judgement:  Poor  Insight:  Lacking  Psychomotor Activity:  Decreased  Concentration:  Concentration: Good and Attention Span: Good  Recall:  Good  Fund of Knowledge:  Good  Language:  Good  Akathisia:  Negative  Handed:  Right  AIMS (if indicated):     Assets:  Communication Skills Desire for Improvement Social Support  ADL's:  Intact  Cognition:  WNL  Sleep:    Okay     Treatment Plan Summary: Medication management and Plan Patient meets criteria for psychiatric inpatient admission.  Disposition: Recommend psychiatric Inpatient admission when medically cleared. Supportive therapy provided about ongoing stressors.  , NP 01/26/2020 12:53 AM

## 2020-01-26 NOTE — [facility] Suicide Risk Assessment (Signed)
[facility] INPATIENT:  Family/Significant Other Suicide Prevention Education  Suicide Prevention Education:  Patient Refusal for Family/Significant Other Suicide Prevention Education: The patient Joseph Aguilar has refused to provide written consent for family/significant other to be provided Family/Significant Other Suicide Prevention Education during admission and/or prior to discharge.  Physician notified.  SPE completed with pt, as pt refused to consent to family contact. SPI pamphlet provided to pt and pt was encouraged to share information with support network, ask questions, and talk about any concerns relating to SPE. Pt denies access to guns/firearms and verbalized understanding of information provided. Mobile Crisis information also provided to pt.   Harden Mo 01/26/2020, 10:17 AM

## 2020-01-26 NOTE — BHH Counselor (Signed)
Adult Comprehensive Assessment  Patient ID: Joseph Aguilar, male   DOB: [birth year], 39 y.o.   MRN: [id number]  Information Source: Information source: Patient  Current Stressors:  Patient states their primary concerns and needs for treatment are:: Pt reports "suicide attempt last night, I tried to cut my wrist; so the police brought me here". Patient states their goals for this hospitilization and ongoing recovery are:: Pt reports "I was just inpatient at the [place] from Nov to last Tuesday, so I had exhausted all that was available for inpatient.  I guess I need too regroup and refocus because things werent working the best way." Educational / Learning stressors: Pt denies. Employment / Job issues: Pt reports "I just returned to work yesterday and i was disoriented and barely did anything all day because of my passwords." Family Relationships: Pt reports "we're not terribly close". Financial / Lack of resources (include bankruptcy): Pt denies. Housing / Lack of housing: Pt denies. Physical health (include injuries & life threatening diseases): Pt reports "side effects from medications like being dizzy, having senory hallucinations." Social relationships: Pt reports "my relationships have deteriorated, I dont have close relationships." Substance abuse: Pt reports some alcohol use Bereavement / Loss: Pt denies.  Living/Environment/Situation:  Living Arrangements: Alone How long has patient lived in current situation?: pt reporst since June 2018. What is atmosphere in current home: Comfortable  Family History:  Marital status: Single Are you sexually active?: No What is your sexual orientation?: Heterosexual Has your sexual activity been affected by drugs, alcohol, medication, or emotional stress?: Pt reports "by my emotional situation, my depression." Does patient have children?: No  Childhood History:  By whom was/is the patient raised?: Both parents Description of patient's relationship with  caregiver when they were a child: Pt reports "it wasnt bad but I didn't have serious talks with them.  I didnt go to them with my problems.  It was more like being talked at." Patient's description of current relationship with people who raised him/her: Pt reports "we talk less and less the more time goes on." How were you disciplined when you got in trouble as a child/adolescent?: Pt reports "grounding". Does patient have siblings?: Yes Number of Siblings: 1 Description of patient's current relationship with siblings: Pt reports "we talk less and less time goes on". Did patient suffer any verbal/emotional/physical/sexual abuse as a child?: No Did patient suffer from severe childhood neglect?: No Has patient ever been sexually abused/assaulted/raped as an adolescent or adult?: No Was the patient ever a victim of a crime or a disaster?: No Witnessed domestic violence?: No Has patient been effected by domestic violence as an adult?: No  Education:  Highest grade of school patient has completed: Chief Operating Officer Currently a Consulting civil engineer?: No Learning disability?: No  Employment/Work Situation:   Employment situation: Employed Where is patient currently employed?: 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 Did You Receive Any Psychiatric Treatment/Services While in the Military?: No(Pt reports that he served 2 tours in [place] in [employer].)  Are There Guns or Other Weapons in [place]?: No  Financial Resources:   Financial  resources: Income from employment, Private insurance Does patient have a representative payee or guardian?: No  Alcohol/Substance Abuse:   What has been your use of drugs/alcohol within the last 12 months?: Alcohol: "1-3 days a week, 1-2 drinks at a time, normal drinks not bottles". If attempted suicide, did drugs/alcohol play a role in this?: Yes(Pt reports that he was drinking while cutting himself that led to this hospitalization.) Alcohol/Substance Abuse Treatment Hx: Denies past history Has alcohol/substance abuse ever caused legal problems?: No  Social Support System:   Patient's Community Support System: Fair Describe Community Support System: Pt reports "through the [place]". Type of faith/religion: Pt denies.  Leisure/Recreation:   Leisure and Hobbies: Pt repports "that's deteriorated. Now I passively watch YouTube or do puzzles.  In the past I was into music and the arts".  Strengths/Needs:   What is the patient's perception of their strengths?: Pt reports "I'm adaptable, intelligent, integrity and compassionate for others". Patient states these barriers may affect/interfere with their treatment: Pt denies. Patient states these barriers may affect their return to the community: Pt denies.  Discharge Plan:   Currently receiving community mental health services: Yes [facility] [place] Patient states concerns and preferences for aftercare planning are: Pt reports that he has a therapist, a psychiatrist, PTSD support group and scheduled for a TBI evaluation through the [place]. Patient states they will know when they are safe and ready for discharge when: Pt reports "just as long as I can focus on the plans I have for continued success". Does patient have access to transportation?: No Does patient have financial barriers related to discharge medications?: No Plan for no access to transportation at discharge: Pt reports that he will need transportation home because the police brought to  the hospital. Will patient be returning to same living situation after discharge?: Yes  Summary/Recommendations:   Summary and Recommendations (to be completed by the evaluator): Patient is a 39 year old male from [place], [place] ([place]).   He presents to the hospital following a suicide attempt via cutting his wrist.  He has a primary diagnosis of Major Depressive Disorder, recurrent episode, severe.  Recommendations include: crisis stabilization, therapeutic milieu, encourage group attendance and participation, medication management for detox/mood stabilization and development of comprehensive mental wellness/sobriety plan.  Rozann Lesches. 01/26/2020

## 2020-01-26 NOTE — BHH Group Notes (Signed)
BHH Group Notes:  (Nursing/MHT/Case Management/Adjunct)  Date:  01/26/2020  Time:  9:03 PM  Type of Therapy:  Group Therapy  Participation Level:  Active  Participation Quality:  Appropriate  Affect:  Appropriate  Cognitive:  Appropriate  Insight:  Appropriate  Engagement in Group:  Engaged  Modes of Intervention:  Discussion  Summary of Progress/Problems:  Burt Ek 01/26/2020, 9:03 PM

## 2020-01-26 NOTE — BHH Group Notes (Signed)
Balance In Life 01/26/2020 1PM  Type of Therapy/Topic:  Group Therapy:  Balance in Life  Participation Level:  Active  Description of Group:   This group will address the concept of balance and how it feels and looks when one is unbalanced. Patients will be encouraged to process areas in their lives that are out of balance and identify reasons for remaining unbalanced. Facilitators will guide patients in utilizing problem-solving interventions to address and correct the stressor making their life unbalanced. Understanding and applying boundaries will be explored and addressed for obtaining and maintaining a balanced life. Patients will be encouraged to explore ways to assertively make their unbalanced needs known to significant others in their lives, using other group members and facilitator for support and feedback.  Therapeutic Goals: 1. Patient will identify two or more emotions or situations they have that consume much of in their lives. 2. Patient will identify signs/triggers that life has become out of balance:  3. Patient will identify two ways to set boundaries in order to achieve balance in their lives:  4. Patient will demonstrate ability to communicate their needs through discussion and/or role plays  Summary of Patient Progress: Actively and appropriately participated in group session. Pt discussed with group members establishing healthy boundaries at his job with coworker. Pt displayed understanding of group topic and appropriately interacted with group members. Pt respected boundaries during session.   Therapeutic Modalities:   Cognitive Behavioral Therapy Solution-Focused Therapy Assertiveness Training  Elynore Dolinski Philip Aspen, LCSW

## 2020-01-26 NOTE — Progress Notes (Signed)
Recreation Therapy Notes  Date: 01/26/2020  Time: 9:30 am  Location: Craft Room  Behavioral response: Appropriate   Intervention Topic: Problem -Solving    Discussion/Intervention:  Group content on today was focused on problem solving. The group described what problem solving is. Patients expressed how problems affect them and how they deal with problems. Individuals identified healthy ways to deal with problems. Patients explained what normally happens to them when they do not deal with problems. The group expressed reoccurring problems for them. The group participated in the intervention "Ways to Solve problems" where patients were given a chance to explore different ways to solve problems.  Clinical Observations/Feedback:  Patient came to group late due to being with the social worker. Individual was social with peers and staff while participating in the intervention.   Sheryl Towell LRT/CTRS         Vale Peraza 01/26/2020 11:40 AM

## 2020-01-26 NOTE — Progress Notes (Signed)
D: Major Depression   A: Affect flat and depressive mood on approach  . Soft spoken.   Brief eye contact  " I don't  have a plan but I think of suicide .  I couldn't intergrade back into work" Requesting supplies for hygiene.  Stated he wanted the influenza vaccine . Writer   Patient calling into work  This am. Patient compliant with current medical issues  medication and laceration site  Improved medication information . Patient working on [employer] , decision making ,and anxiety  issues .  Continue thought of suicidal  ideations at present limited interaction with  [place] Laceration site  Dressing  Changed,  Stiches  In place  No drainage at site old drainage on bandage  Encourage patient participation with unit programming . Instruction  Given on  Medication , verbalize understanding.   R: Voice no other concerns. Staff continue to monitor

## 2020-01-26 NOTE — Tx Team (Signed)
Initial Treatment Plan 01/26/2020 2:52 AM Harvie Bridge VAN:[id number]    PATIENT STRESSORS: Medication change or noncompliance Traumatic event   PATIENT STRENGTHS: Motivation for treatment/growth Physical Health   PATIENT IDENTIFIED PROBLEMS: Suicidal ideation     Depression       Anxiety            DISCHARGE CRITERIA:  Improved stabilization in mood, thinking, and/or behavior Medical problems require only outpatient monitoring  PRELIMINARY DISCHARGE PLAN: Outpatient therapy  PATIENT/FAMILY INVOLVEMENT: This treatment plan has been presented to and reviewed with the patient, Joseph Aguilar,  The patient and family have been given the opportunity to ask questions and make suggestions.  Trula Ore, RN 01/26/2020, 2:52 AM

## 2020-01-26 NOTE — BHH Suicide Risk Assessment (Signed)
[facility] Admission Suicide Risk Assessment   Nursing information obtained from:  Patient Demographic factors:  Male, Caucasian, Unemployed Current Mental Status:  Self-harm behaviors Loss Factors:  NA Historical Factors:  Impulsivity Risk Reduction Factors:  NA  Total Time spent with patient: 1 hour Principal Problem: MDD (major depressive disorder), recurrent episode, severe ([place]) Diagnosis:  Principal Problem:   MDD (major depressive disorder), recurrent episode, severe ([place]) Active Problems:   Self-inflicted laceration of left wrist ([place])  Subjective Data: Patient seen chart reviewed.  This 39 year old man came to the emergency room by EMS after calling 911 for himself after he cut his left wrist.  He indicates that this happened while he was at home the evening before last.  He does not really report a single specific stressor that brought this on.  He describes himself as having been "not overwhelmed" when it happened.  He was just discharged from the [facility] in [place] about a week ago for depression.  He had been in the hospital for over 2 months.  He reports that since getting out he has tried to get back into work.  Yesterday was his first day trying to get back into work and it sounds like it felt pretty frustrating.  He was focusing more on his depression and his lack of enjoyment or interest in anything.  Feeling hopeless.  The cutting of himself was spontaneous but he describes having anger or really intense emotions at the time.  As soon as he cut himself he says he realized that it was going to probably need stitches and he called for help himself.  He has been compliant with prescribed medicine since discharge continuing to take Effexor Abilify trazodone and as needed hydroxyzine.  Patient describes having chronic anhedonia.  Enjoys very little about life.  Feels unmotivated takes little pleasure in anything.  This is been present at least since 2015 when he left the [facility] although  he says really it has probably been there for a long time before that.  Takes medication to help with sleep.  Appetite pretty stable.  Denies any psychotic symptoms denies any thought of hurting anyone else.  Patient had an alcohol level of about 25 when he came to the emergency room.  He admits that he had had some alcohol to drink that day but in general he plays down the role that alcohol plays in any of his mood symptoms saying that he drinks only occasionally and that he does not feel like it has a major effect on his symptoms.    Continued Clinical Symptoms:  Alcohol Use Disorder Identification Test Final Score (AUDIT): 1 The "Alcohol Use Disorders Identification Test", Guidelines for Use in Primary Care, Second Edition.  World Pharmacologist [facility]). Score between 0-7:  no or low risk or alcohol related problems. Score between 8-15:  moderate risk of alcohol related problems. Score between 16-19:  high risk of alcohol related problems. Score 20 or above:  warrants further diagnostic evaluation for alcohol dependence and treatment.   CLINICAL FACTORS:   Severe Anxiety and/or Agitation Depression:   Anhedonia Hopelessness   Musculoskeletal: Strength & Muscle Tone: within normal limits Gait & Station: normal Patient leans: N/A  Psychiatric Specialty Exam: Physical Exam  Nursing note and vitals reviewed. Constitutional: He appears well-developed and well-nourished.  HENT:  Head: Normocephalic and atraumatic.  Eyes: Pupils are equal, round, and reactive to light. Conjunctivae are normal.  Cardiovascular: Regular rhythm and normal heart sounds.  Respiratory: Effort normal.  No respiratory distress.  GI: Soft.  Musculoskeletal:        General: Normal range of motion.     Cervical back: Normal range of motion.  Neurological: He is alert.  Skin: Skin is warm and dry.  Patient has a laceration which has been sutured appropriately and is now bandaged on his left wrist.   Psychiatric: Judgment normal. His speech is delayed. He is slowed. Thought content is not paranoid. Cognition and memory are normal. He exhibits a depressed mood. He expresses no homicidal and no suicidal ideation.    Review of Systems  Constitutional: Negative.   HENT: Negative.   Eyes: Negative.   Respiratory: Negative.   Cardiovascular: Negative.   Gastrointestinal: Negative.   Musculoskeletal: Negative.   Skin: Negative.   Neurological: Negative.   Psychiatric/Behavioral: Positive for dysphoric mood, self-injury and sleep disturbance.    Blood pressure (!) 141/98, pulse 60, temperature 97.9 F (36.6 C), temperature source Oral, resp. rate 16, height 6\' 1"  (1.854 m), weight 64 kg, SpO2 100 %.Body mass index is 18.6 kg/m.  General Appearance: Casual  Eye Contact:  Fair  Speech:  Slow  Volume:  Normal  Mood:  Depressed and Dysphoric  Affect:  Congruent  Thought Process:  Coherent  Orientation:  Full (Time, Place, and Person)  Thought Content:  Logical  Suicidal Thoughts:  Yes.  without intent/plan  Homicidal Thoughts:  No  Memory:  Immediate;   Fair Recent;   Fair Remote;   Fair  Judgement:  Fair  Insight:  Fair  Psychomotor Activity:  Decreased  Concentration:  Concentration: Fair  Recall:  of Knowledge:  Fair  Language:  Fair  Akathisia:  No  Handed:  Right  AIMS (if indicated):     Assets:  Communication Skills Desire for Improvement Financial Resources/Insurance Housing Physical Health Resilience  ADL's:  Intact  Cognition:  WNL  Sleep:         COGNITIVE FEATURES THAT CONTRIBUTE TO RISK:  Thought constriction (tunnel vision)    SUICIDE RISK:   Moderate:  Frequent suicidal ideation with limited intensity, and duration, some specificity in terms of plans, no associated intent, good self-control, limited dysphoria/symptomatology, some risk factors present, and identifiable protective factors, including available and accessible social  support.  PLAN OF CARE: Continue 15-minute checks.  Restart antidepressant medicine.  Try to get him involved in therapy here on the unit.  Tried to reach his outpatient providers as well.  Reassess suicidality before making discharge plans.  I certify that inpatient services furnished can reasonably be expected to improve the patient's condition.   [employer], MD 01/26/2020, 1:34 PM

## 2020-01-26 NOTE — Progress Notes (Addendum)
Initial Nutrition Assessment  DOCUMENTATION CODES:   Underweight  INTERVENTION:   Ensure Enlive po TID, each supplement provides 350 kcal and 20 grams of protein  MVI daily   NUTRITION DIAGNOSIS:   Predicted suboptimal nutrient intake related to social / environmental circumstances as evidenced by other (comment)(pt is underweight).  GOAL:   Patient will meet greater than or equal to 90% of their needs  MONITOR:   PO intake, Supplement acceptance  REASON FOR ASSESSMENT:   Malnutrition Screening Tool    ASSESSMENT:   39 y.o. male with a history of severe depression as well as PTSD presents to the ER for suicidal ideation as well as attempted suicide after cutting himself.   RD working remotely.  Suspect pt with poor appetite and oral intake at baseline as pt is underweight. RD will add supplements and MVI to help pt meet his estimated needs. There is no weight history in chart to determine if any significant weight changes.   Pt is at high risk for malnutrition   Medications and labs reviewed:   Diet Order:   Diet Order            Diet regular Room service appropriate? Yes; Fluid consistency: Thin  Diet effective now             EDUCATION NEEDS:   No education needs have been identified at this time  Skin:  Skin Assessment: Reviewed RN Assessment(laceration L wrist)  Last BM:  pta  Height:   Ht Readings from Last 1 Encounters:  01/26/20 6\' 1"  (1.854 m)    Weight:   Wt Readings from Last 1 Encounters:  01/26/20 64 kg    Ideal Body Weight:  83.6 kg  BMI:  Body mass index is 18.6 kg/m.  Estimated Nutritional Needs:   Kcal:  2100-2400kcal/day  Protein:  100-115g/day  Fluid:  >1.9L/day  01/28/20 MS, RD, LDN Contact information available in Amion

## 2020-01-26 NOTE — Progress Notes (Signed)
Patient presents with sad flat affect. Endorses SI with no plan or no intent. Reports being slightly anxious, hopeful that requested and given trazadone will help with sleep. In no distress. Denies HI, and avh. Encouragement and support offered. Safety checks maintained. Patient receptive and remains safe on unit with q 15 min checks.

## 2020-01-27 DIAGNOSIS — F332 Major depressive disorder, recurrent severe without psychotic features: Secondary | ICD-10-CM | POA: Diagnosis not present

## 2020-01-27 MED ORDER — HYDROXYZINE HCL 50 MG PO TABS
50.0000 mg | ORAL_TABLET | Freq: Four times a day (QID) | ORAL | Status: DC | PRN
  Filled 2020-01-27: qty 1

## 2020-01-27 MED ORDER — LITHIUM CARBONATE ER 300 MG PO TBCR
300.0000 mg | EXTENDED_RELEASE_TABLET | Freq: Two times a day (BID) | ORAL | Status: DC
  Administered 2020-01-27 – 2020-01-28 (×2): 300 mg via ORAL
  Filled 2020-01-27 (×2): qty 1

## 2020-01-27 NOTE — Progress Notes (Signed)
   01/27/20 1100  Clinical Encounter Type  Visited With Patient;Other (Comment)  Visit Type Initial;Spiritual support;Social support;Behavioral Health  Referral From Chaplain  Consult/Referral To Chaplain  Chaplain encountered patient during a group. Chaplain briefly spoke to patient after group and thanked him for serving our country. Chaplain offered pastoral presence and empathy.

## 2020-01-27 NOTE — BHH Group Notes (Signed)
LCSW Group Therapy Note  01/27/2020 11:50 AM  Type of Therapy/Topic:  Group Therapy:  Balance in Life  Participation Level:  Active  Description of Group:    This group will address the concept of balance and how it feels and looks when one is unbalanced. Patients will be encouraged to process areas in their lives that are out of balance and identify reasons for remaining unbalanced. Facilitators will guide patients in utilizing problem-solving interventions to address and correct the stressor making their life unbalanced. Understanding and applying boundaries will be explored and addressed for obtaining and maintaining a balanced life. Patients will be encouraged to explore ways to assertively make their unbalanced needs known to significant others in their lives, using other group members and facilitator for support and feedback.  Therapeutic Goals: 1. Patient will identify two or more emotions or situations they have that consume much of in their lives. 2. Patient will identify signs/triggers that life has become out of balance:  3. Patient will identify two ways to set boundaries in order to achieve balance in their lives:  4. Patient will demonstrate ability to communicate their needs through discussion and/or role plays  Summary of Patient Progress: Pt was appropriate and respectful in group. Pt was able to identify areas that need improvement in his life. Pt discussed writing out a discharge plan step-by-step to assist him when he discharges so he does not end up inpatient again. Pt reported plans to have a calendar and schedule to follow to assist himself with structure.     Therapeutic Modalities:   Cognitive Behavioral Therapy Solution-Focused Therapy Assertiveness Training  Iris Pert, MSW, LCSW Clinical Social Work 01/27/2020 11:50 AM

## 2020-01-27 NOTE — BHH Group Notes (Signed)
BHH Group Notes:  (Nursing/MHT/Case Management/Adjunct)  Date:  01/27/2020  Time:  9:09 AM  Type of Therapy:  Community Meeting  Participation Level:  Active  Participation Quality:  Appropriate and Attentive  Affect:  Appropriate  Cognitive:  Alert and Appropriate  Insight:  Appropriate  Engagement in Group:  Engaged  Modes of Intervention:  Activity and Education    Summary of Progress/Problems:  Joseph Aguilar 01/27/2020, 9:09 AM

## 2020-01-27 NOTE — Progress Notes (Signed)
D Major Depression  A: Patient stated slept good last night .Stated appetite good and energy level   normal. Stated concentration good . Stated on Depression scale  5, hopeless 3 and anxiety 3 .( low 0-10 high)Thoughts of  suicidal ideations today and how he would handle this. Patient stated they are fleeting and  He has no plan to act on them .  Patient has made his  Discharge planning list. No auditory hallucinations  No pain concerns . Appropriate ADL'S. Interacting with peers and staff. Patient compliant with current medical issues  medication and laceration site  Improved . Patient working on [employer] , decision making ,and anxiety  issues . Continue thought of suicidal  ideations at present Interaction with  peers Encourage patient participation with unit programming . Instruction  Given on  Medication , verbalize understanding.  R: Voice no other concerns. Staff continue to monitor

## 2020-01-27 NOTE — Plan of Care (Signed)
Patient compliant with current medical issues  medication and laceration site  Improved medication information . Patient working on [employer] , decision making ,and anxiety  issues .  Continue thought of suicidal  ideations at present limited interaction with  peers Problem: Consults Goal: Concurrent Medical Patient Education Description: (See Patient Education Module for education specifics) Outcome: Progressing   Problem: [facility] Concurrent Medical Problem Goal: LTG-Pt will be physically stable and he/significant other Description: (Patient will be physically stable and he/significant other will be able to verbalize understanding of follow-up care and symptoms that would warrant further treatment) Outcome: Progressing Goal: STG-Vital signs will be within defined limits or stabilized Description: (STG- Vital signs will be within defined limits or stabilized for individual) Outcome: Progressing Goal: STG-Compliance with medication and/or treatment as ordered Description: (STG-Compliance with medication and/or treatment as ordered by MD) Outcome: Progressing Goal: STG-Verbalize two symptoms that would warrant further Description: (STG-Verbalize two symptoms that would warrant further treatment) Outcome: Progressing Goal: STG-Patient will participate in management/stabilization Description: (STG-Patient will participate in management/stabilization of medical condition) Outcome: Progressing Goal: STG-Other (Specify): Description: STG-Other Concurrent Medical (Specify): Outcome: Progressing   Problem: Education: Goal: Ability to make informed decisions regarding treatment will improve Outcome: Progressing   Problem: Health Behavior/Discharge Planning: Goal: Identification of resources available to assist in meeting health care needs will improve Outcome: Progressing   Problem: Medication: Goal: Compliance with prescribed medication regimen will improve Outcome: Progressing    Problem: Self-Concept: Goal: Ability to disclose and discuss suicidal ideas will improve Outcome: Progressing Goal: Will verbalize positive feelings about self Outcome: Progressing   Problem: Education: Goal: Utilization of techniques to improve thought processes will improve Outcome: Progressing Goal: Knowledge of the prescribed therapeutic regimen will improve Outcome: Progressing   Problem: Activity: Goal: Interest or engagement in leisure activities will improve Outcome: Progressing Goal: Imbalance in normal sleep/wake cycle will improve Outcome: Progressing   Problem: Health Behavior/Discharge Planning: Goal: Ability to make decisions will improve Outcome: Progressing Goal: Compliance with therapeutic regimen will improve Outcome: Progressing   Problem: Role Relationship: Goal: Will demonstrate positive changes in social behaviors and relationships Outcome: Progressing   Problem: Self-Concept: Goal: Will verbalize positive feelings about self Outcome: Progressing Goal: Level of anxiety will decrease Outcome: Progressing

## 2020-01-27 NOTE — Tx Team (Addendum)
Interdisciplinary Treatment and Diagnostic Plan Update  01/27/2020 Time of Session: 9:00AM Joseph Aguilar MRN: [id number]  Principal Diagnosis: MDD (major depressive disorder), recurrent episode, severe ([place])  Secondary Diagnoses: Principal Problem:   MDD (major depressive disorder), recurrent episode, severe ([place]) Active Problems:   Self-inflicted laceration of left wrist ([place])   Current Medications:  Current Facility-Administered Medications  Medication Dose Route Frequency Provider Last Rate Last Admin  . acetaminophen (TYLENOL) tablet 650 mg  650 mg Oral Q6H PRN Caroline Sauger, NP      . alum & mag hydroxide-simeth (MAALOX/MYLANTA) 200-200-20 MG/5ML suspension 30 mL  30 mL Oral Q4H PRN Caroline Sauger, NP      . ARIPiprazole (ABILIFY) tablet 10 mg  10 mg Oral Daily Clapacs, Madie Reno, MD   10 mg at 01/27/20 0754  . feeding supplement (ENSURE ENLIVE) (ENSURE ENLIVE) liquid 237 mL  237 mL Oral TID BM Clapacs, John T, MD   237 mL at 01/26/20 2110  . hydrOXYzine (ATARAX/VISTARIL) tablet 50 mg  50 mg Oral Q6H PRN Clapacs, John T, MD      . magnesium hydroxide (MILK OF MAGNESIA) suspension 30 mL  30 mL Oral Daily PRN Caroline Sauger, NP      . multivitamin with minerals tablet 1 tablet  1 tablet Oral Daily Clapacs, Madie Reno, MD   1 tablet at 01/27/20 0754  . traZODone (DESYREL) tablet 100 mg  100 mg Oral QHS Caroline Sauger, NP   100 mg at 01/26/20 2109  . venlafaxine XR (EFFEXOR-XR) 24 hr capsule 225 mg  225 mg Oral Q breakfast Clapacs, Madie Reno, MD   225 mg at 01/27/20 0754   PTA Medications: No medications prior to admission.    Patient Stressors: Medication change or noncompliance Traumatic event  Patient Strengths: Motivation for treatment/growth Physical Health  Treatment Modalities: Medication Management, Group therapy, Case management,  1 to 1 session with clinician, Psychoeducation, Recreational therapy.   Physician Treatment Plan for Primary Diagnosis: MDD  (major depressive disorder), recurrent episode, severe ([place]) Long Term Goal(s): Improvement in symptoms so as ready for discharge Improvement in symptoms so as ready for discharge   Short Term Goals: Ability to verbalize feelings will improve Ability to disclose and discuss suicidal ideas Ability to demonstrate self-control will improve Ability to identify and develop effective coping behaviors will improve Ability to maintain clinical measurements within normal limits will improve  Medication Management: Evaluate patient's response, side effects, and tolerance of medication regimen.  Therapeutic Interventions: 1 to 1 sessions, Unit Group sessions and Medication administration.  Evaluation of Outcomes: Progressing  Physician Treatment Plan for Secondary Diagnosis: Principal Problem:   MDD (major depressive disorder), recurrent episode, severe ([place]) Active Problems:   Self-inflicted laceration of left wrist ([place])  Long Term Goal(s): Improvement in symptoms so as ready for discharge Improvement in symptoms so as ready for discharge   Short Term Goals: Ability to verbalize feelings will improve Ability to disclose and discuss suicidal ideas Ability to demonstrate self-control will improve Ability to identify and develop effective coping behaviors will improve Ability to maintain clinical measurements within normal limits will improve     Medication Management: Evaluate patient's response, side effects, and tolerance of medication regimen.  Therapeutic Interventions: 1 to 1 sessions, Unit Group sessions and Medication administration.  Evaluation of Outcomes: Progressing   RN Treatment Plan for Primary Diagnosis: MDD (major depressive disorder), recurrent episode, severe ([place]) Long Term Goal(s): Knowledge of disease and therapeutic regimen to maintain health will improve  Short  Term Goals: Ability to demonstrate self-control, Ability to participate in decision making will improve,  Ability to verbalize feelings will improve, Ability to disclose and discuss suicidal ideas, Ability to identify and develop effective coping behaviors will improve and Compliance with prescribed medications will improve  Medication Management: RN will administer medications as ordered by provider, will assess and evaluate patient's response and provide education to patient for prescribed medication. RN will report any adverse and/or side effects to prescribing provider.  Therapeutic Interventions: 1 on 1 counseling sessions, Psychoeducation, Medication administration, Evaluate responses to treatment, Monitor vital signs and CBGs as ordered, Perform/monitor CIWA, COWS, AIMS and Fall Risk screenings as ordered, Perform wound care treatments as ordered.  Evaluation of Outcomes: Progressing   LCSW Treatment Plan for Primary Diagnosis: MDD (major depressive disorder), recurrent episode, severe ([facility]) Long Term Goal(s): Safe transition to appropriate next level of care at discharge, Engage patient in therapeutic group addressing interpersonal concerns.  Short Term Goals: Engage patient in aftercare planning with referrals and resources, Increase social support, Increase ability to appropriately verbalize feelings, Increase emotional regulation, Facilitate acceptance of mental health diagnosis and concerns and Increase skills for wellness and recovery  Therapeutic Interventions: Assess for all discharge needs, 1 to 1 time with Social worker, Explore available resources and support systems, Assess for adequacy in community support network, Educate family and significant other(s) on suicide prevention, Complete Psychosocial Assessment, Interpersonal group therapy.  Evaluation of Outcomes: Progressing   Progress in Treatment: Attending groups: Yes. Participating in groups: Yes. Taking medication as prescribed: Yes. Toleration medication: Yes. Family/Significant other contact made: No, will contact:  pt  declined SPE contact with family. SPE completed with patient.  Patient understands diagnosis: Yes. Discussing patient identified problems/goals with staff: Yes. Medical problems stabilized or resolved: Yes. Denies suicidal/homicidal ideation: Yes. Issues/concerns per patient self-inventory: No. Other: none  New problem(s) identified: No, Describe:  none  New Short Term/Long Term Goal(s): medication management for mood stabilization; elimination of SI thoughts; development of comprehensive mental wellness plan.  Patient Goals:  "  Discharge Plan or Barriers: Patient reports plans to return home. [facility] [place]  Reason for Continuation of Hospitalization: Anxiety Depression Medication stabilization Suicidal ideation  Estimated Length of Stay: 1-7 days  Recreational Therapy: Patient Stressors: N/A Patient Goal: Patient will engage in groups without prompting or encouragement from LRT x3 group sessions within 5 recreation therapy group sessions  Attendees: Patient:  Joseph Aguilar 01/27/2020 10:39 AM  Physician: Dr. Toni Amend, MD 01/27/2020 10:39 AM  Nursing: Hulan Amato, RN 01/27/2020 10:39 AM  RN Care Manager: 01/27/2020 10:39 AM  Social Worker: Penni Homans, LCSW 01/27/2020 10:39 AM  Recreational Therapist: Hilbert Bible, LRT 01/27/2020 10:39 AM  Other: Iris Pert, LCSW 01/27/2020 10:39 AM  Other: Lowella Dandy, LCSW 01/27/2020 10:39 AM  Other: 01/27/2020 10:39 AM    Scribe for Treatment Team: Harden Mo, LCSW 01/27/2020 10:39 AM

## 2020-01-27 NOTE — Progress Notes (Signed)
[facility] MD Progress Note  01/27/2020 3:27 PM Joseph Aguilar  MRN:  [id number] Subjective: Patient seen and chart reviewed.  39 year old man with a history of major depression and suicidal behavior.  Patient was very appropriate today.  He brought in a sheet of paper on which he had been detailing his psychiatric treatment plan in the future including reviewing his plans to see his physician and his therapist and to try to work out a schedule to get transcranial magnetic stimulation which had been discussed while he was at the [place].  Patient has been attending groups today appropriately.  He says he is feeling a little bit tired today but otherwise has no specific physical complaints.  Slept adequately last night.  He has passive suicidal thought with no actual intent or plan currently sleep. Principal Problem: MDD (major depressive disorder), recurrent episode, severe (HCC) Diagnosis: Principal Problem:   MDD (major depressive disorder), recurrent episode, severe (HCC) Active Problems:   Self-inflicted laceration of left wrist (HCC)  Total Time spent with patient: 30 minutes  Past Psychiatric History: Past history of depression with a lengthy recent hospitalization through the [place] system  Past Medical History:  Past Medical History:  Diagnosis Date  . PTSD (post-traumatic stress disorder)   . Severe depression (HCC)     Past Surgical History:  Procedure Laterality Date  . WISDOM TOOTH EXTRACTION     Family History: History reviewed. No pertinent family history. Family Psychiatric  History: See previous note Social History:  Social History   Substance and Sexual Activity  Alcohol Use Yes   Comment: social     Social History   Substance and Sexual Activity  Drug Use Never    Social History   Socioeconomic History  . Marital status: Single    Spouse name: Not on file  . Number of children: Not on file  . Years of education: Not on file  . Highest education level: Not on file   Occupational History  . Not on file  Tobacco Use  . Smoking status: Never Smoker  . Smokeless tobacco: Never Used  Substance and Sexual Activity  . Alcohol use: Yes    Comment: social  . Drug use: Never  . Sexual activity: Not on file  Other Topics Concern  . Not on file  Social History Narrative  . Not on file   Social Determinants of Health   Financial Resource Strain:   . Difficulty of Paying Living Expenses: Not on file  Food Insecurity:   . Worried About Programme researcher, broadcasting/film/video in the Last Year: Not on file  . Ran Out of Food in the Last Year: Not on file  Transportation Needs:   . Lack of Transportation (Medical): Not on file  . Lack of Transportation (Non-Medical): Not on file  Physical Activity:   . Days of Exercise per Week: Not on file  . Minutes of Exercise per Session: Not on file  Stress:   . Feeling of Stress : Not on file  Social Connections:   . Frequency of Communication with Friends and Family: Not on file  . Frequency of Social Gatherings with Friends and Family: Not on file  . Attends Religious Services: Not on file  . Active Member of Clubs or Organizations: Not on file  . Attends Banker Meetings: Not on file  . Marital Status: Not on file   Additional Social History:  Sleep: Fair  Appetite:  Fair  Current Medications: Current Facility-Administered Medications  Medication Dose Route Frequency Provider Last Rate Last Admin  . acetaminophen (TYLENOL) tablet 650 mg  650 mg Oral Q6H PRN Caroline Sauger, NP      . alum & mag hydroxide-simeth (MAALOX/MYLANTA) 200-200-20 MG/5ML suspension 30 mL  30 mL Oral Q4H PRN Caroline Sauger, NP      . ARIPiprazole (ABILIFY) tablet 10 mg  10 mg Oral Daily Devani Odonnel, Madie Reno, MD   10 mg at 01/27/20 0754  . feeding supplement (ENSURE ENLIVE) (ENSURE ENLIVE) liquid 237 mL  237 mL Oral TID BM Rolin Schult T, MD   237 mL at 01/27/20 1416  . hydrOXYzine  (ATARAX/VISTARIL) tablet 50 mg  50 mg Oral Q6H PRN Ily Denno T, MD      . lithium carbonate (LITHOBID) CR tablet 300 mg  300 mg Oral Q12H Sharbel Sahagun T, MD      . magnesium hydroxide (MILK OF MAGNESIA) suspension 30 mL  30 mL Oral Daily PRN Caroline Sauger, NP      . multivitamin with minerals tablet 1 tablet  1 tablet Oral Daily Kehinde Bowdish, Madie Reno, MD   1 tablet at 01/27/20 0754  . traZODone (DESYREL) tablet 100 mg  100 mg Oral QHS Caroline Sauger, NP   100 mg at 01/26/20 2109  . venlafaxine XR (EFFEXOR-XR) 24 hr capsule 225 mg  225 mg Oral Q breakfast Benton Tooker, Madie Reno, MD   225 mg at 01/27/20 0754    Lab Results:  Results for orders placed or performed during the hospital encounter of 01/25/20 (from the past 48 hour(s))  Comprehensive metabolic panel     Status: Abnormal   Collection Time: 01/25/20  8:35 PM  Result Value Ref Range   Sodium 140 135 - 145 mmol/L   Potassium 3.6 3.5 - 5.1 mmol/L   Chloride 105 98 - 111 mmol/L   CO2 24 22 - 32 mmol/L   Glucose, Bld 122 (H) 70 - 99 mg/dL   BUN 14 6 - 20 mg/dL   Creatinine, Ser 0.79 0.61 - 1.24 mg/dL   Calcium 9.1 8.9 - 10.3 mg/dL   Total Protein 7.3 6.5 - 8.1 g/dL   Albumin 4.4 3.5 - 5.0 g/dL   AST 24 15 - 41 U/L   ALT 19 0 - 44 U/L   Alkaline Phosphatase 87 38 - 126 U/L   Total Bilirubin 1.0 0.3 - 1.2 mg/dL   GFR calc non Af Amer >60 >60 mL/min   GFR calc Af Amer >60 >60 mL/min   Anion gap 11 5 - 15    Comment: Performed at [facility], [address] [place], [place] [address]  Ethanol     Status: Abnormal   Collection Time: 01/25/20  8:35 PM  Result Value Ref Range   Alcohol, Ethyl (B) 26 (H) <10 mg/dL    Comment: (NOTE) Lowest detectable limit for serum alcohol is 10 mg/dL. For medical purposes only. Performed at [facility], [address] [place], [place] [address]   Salicylate level     Status: Abnormal   Collection Time: 01/25/20  8:35 PM  Result Value Ref Range   Salicylate Lvl  <0.9 (L) 7.0 - 30.0 mg/dL    Comment: Performed at [facility], [address] [place], [place] [address]  Acetaminophen level     Status: Abnormal   Collection Time: 01/25/20  8:35 PM  Result Value Ref Range   Acetaminophen (  Tylenol), Serum <10 (L) 10 - 30 ug/mL    Comment: (NOTE) Therapeutic concentrations vary significantly. A range of 10-30 ug/mL  may be an effective concentration for many patients. However, some  are best treated at concentrations outside of this range. Acetaminophen concentrations >150 ug/mL at 4 hours after ingestion  and >50 ug/mL at 12 hours after ingestion are often associated with  toxic reactions. Performed at [facility], [address] [place], [place] [address]   cbc     Status: None   Collection Time: 01/25/20  8:35 PM  Result Value Ref Range   WBC 5.3 4.0 - 10.5 K/uL   RBC 4.48 4.22 - 5.81 MIL/uL   Hemoglobin 13.6 13.0 - 17.0 g/dL   HCT 06.2 37.6 - 28.3 %   MCV 89.7 80.0 - 100.0 fL   MCH 30.4 26.0 - 34.0 pg   MCHC 33.8 30.0 - 36.0 g/dL   RDW 15.1 76.1 - 60.7 %   Platelets 281 150 - 400 K/uL   nRBC 0.0 0.0 - 0.2 %    Comment: Performed at [facility], [address], [place], [place] [address]  Urine Drug Screen, Qualitative     Status: None   Collection Time: 01/25/20  8:35 PM  Result Value Ref Range   Tricyclic, Ur Screen NONE DETECTED NONE DETECTED   Amphetamines, Ur Screen NONE DETECTED NONE DETECTED   MDMA (Ecstasy)Ur Screen NONE DETECTED NONE DETECTED   Cocaine Metabolite,Ur [place] NONE DETECTED NONE DETECTED   Opiate, Ur Screen NONE DETECTED NONE DETECTED   Phencyclidine (PCP) Ur S NONE DETECTED NONE DETECTED   Cannabinoid 50 Ng, Ur [place] NONE DETECTED NONE DETECTED   Barbiturates, Ur Screen NONE DETECTED NONE DETECTED   Benzodiazepine, Ur Scrn NONE DETECTED NONE DETECTED   Methadone Scn, Ur NONE DETECTED NONE DETECTED    Comment: (NOTE) Tricyclics + metabolites, urine    Cutoff 1000 ng/mL Amphetamines +  metabolites, urine  Cutoff 1000 ng/mL MDMA (Ecstasy), urine              Cutoff 500 ng/mL Cocaine Metabolite, urine          Cutoff 300 ng/mL Opiate + metabolites, urine        Cutoff 300 ng/mL Phencyclidine (PCP), urine         Cutoff 25 ng/mL Cannabinoid, urine                 Cutoff 50 ng/mL Barbiturates + metabolites, urine  Cutoff 200 ng/mL Benzodiazepine, urine              Cutoff 200 ng/mL Methadone, urine                   Cutoff 300 ng/mL The urine drug screen provides only a preliminary, unconfirmed analytical test result and should not be used for non-medical purposes. Clinical consideration and professional judgment should be applied to any positive drug screen result due to possible interfering substances. A more specific alternate chemical method must be used in order to obtain a confirmed analytical result. Gas chromatography / mass spectrometry (GC/MS) is the preferred confirmat ory method. Performed at [facility], [address] [place], [place] [address]   Respiratory Panel by RT PCR (Flu A&B, Covid) - Nasopharyngeal Swab     Status: None   Collection Time: 01/25/20  8:39 PM   Specimen: Nasopharyngeal Swab  Result Value Ref Range   SARS Coronavirus 2 by RT PCR NEGATIVE NEGATIVE  Comment: (NOTE) SARS-CoV-2 target nucleic acids are NOT DETECTED. The SARS-CoV-2 RNA is generally detectable in upper respiratoy specimens during the acute phase of infection. The lowest concentration of SARS-CoV-2 viral copies this assay can detect is 131 copies/mL. A negative result does not preclude SARS-Cov-2 infection and should not be used as the sole basis for treatment or other patient management decisions. A negative result may occur with  improper specimen collection/handling, submission of specimen other than nasopharyngeal swab, presence of viral mutation(s) within the areas targeted by this assay, and inadequate number of viral copies (<131 copies/mL). A  negative result must be combined with clinical observations, patient history, and epidemiological information. The expected result is Negative. Fact Sheet for Patients:  https://www.moore.com/ Fact Sheet for Healthcare Providers:  https://www.young.biz/ This test is not yet ap proved or cleared by the [place] FDA and  has been authorized for detection and/or diagnosis of SARS-CoV-2 by FDA under an Emergency Use Authorization (EUA). This EUA will remain  in effect (meaning this test can be used) for the duration of the COVID-19 declaration under Section 564(b)(1) of the Act, 21 U.S.C. section 360bbb-3(b)(1), unless the authorization is terminated or revoked sooner.    Influenza A by PCR NEGATIVE NEGATIVE   Influenza B by PCR NEGATIVE NEGATIVE    Comment: (NOTE) The Xpert Xpress SARS-CoV-2/FLU/RSV assay is intended as an aid in  the diagnosis of influenza from Nasopharyngeal swab specimens and  should not be used as a sole basis for treatment. Nasal washings and  aspirates are unacceptable for Xpert Xpress SARS-CoV-2/FLU/RSV  testing. Fact Sheet for Patients: https://www.moore.com/ Fact Sheet for Healthcare Providers: https://www.young.biz/ This test is not yet approved or cleared by the [place] FDA and  has been authorized for detection and/or diagnosis of SARS-CoV-2 by  FDA under an Emergency Use Authorization (EUA). This EUA will remain  in effect (meaning this test can be used) for the duration of the  Covid-19 declaration under Section 564(b)(1) of the Act, 21  U.S.C. section 360bbb-3(b)(1), unless the authorization is  terminated or revoked. Performed at [facility], [address] [place], [place] [address]     Blood Alcohol level:  Lab Results  Component Value Date   ETH 26 (H) 01/25/2020    Metabolic Disorder Labs: No results found for: HGBA1C, MPG No results found  for: PROLACTIN No results found for: CHOL, TRIG, HDL, CHOLHDL, VLDL, LDLCALC  Physical Findings: AIMS: Facial and Oral Movements Muscles of Facial Expression: None, normal Lips and Perioral Area: None, normal Jaw: None, normal Tongue: None, normal,Extremity Movements Upper (arms, wrists, hands, fingers): None, normal Lower (legs, knees, ankles, toes): None, normal, Trunk Movements Neck, shoulders, hips: None, normal, Overall Severity Severity of abnormal movements (highest score from questions above): None, normal Incapacitation due to abnormal movements: None, normal Patient's awareness of abnormal movements (rate only patient's report): No Awareness, Dental Status Current problems with teeth and/or dentures?: No Does patient usually wear dentures?: No  CIWA:  CIWA-Ar Total: 0 COWS:  COWS Total Score: 0  Musculoskeletal: Strength & Muscle Tone: within normal limits Gait & Station: normal Patient leans: N/A  Psychiatric Specialty Exam: Physical Exam  Nursing note and vitals reviewed. Constitutional: He appears well-developed and well-nourished.  HENT:  Head: Normocephalic and atraumatic.  Eyes: Pupils are equal, round, and reactive to light. Conjunctivae are normal.  Cardiovascular: Regular rhythm and normal heart sounds.  Respiratory: Effort normal.  GI: Soft.  Musculoskeletal:        General: Normal  range of motion.     Cervical back: Normal range of motion.  Neurological: He is alert.  Skin: Skin is warm and dry.  Psychiatric: Judgment normal. His affect is blunt. His speech is delayed. He is slowed. Cognition and memory are normal. He expresses suicidal ideation. He expresses no suicidal plans.    Review of Systems  Constitutional: Negative.   HENT: Negative.   Eyes: Negative.   Respiratory: Negative.   Cardiovascular: Negative.   Gastrointestinal: Negative.   Musculoskeletal: Negative.   Skin: Negative.   Neurological: Negative.   Psychiatric/Behavioral:  Positive for dysphoric mood.    Blood pressure 126/85, pulse 72, temperature 98 F (36.7 C), temperature source Oral, resp. rate 18, height 6\' 1"  (1.854 m), weight 64 kg, SpO2 100 %.Body mass index is 18.6 kg/m.  General Appearance: Casual  Eye Contact:  Minimal  Speech:  Slow  Volume:  Decreased  Mood:  Dysphoric  Affect:  Blunt  Thought Process:  Coherent  Orientation:  Full (Time, Place, and Person)  Thought Content:  Logical  Suicidal Thoughts:  Yes.  without intent/plan  Homicidal Thoughts:  No  Memory:  Immediate;   Fair Recent;   Fair Remote;   Fair  Judgement:  Fair  Insight:  Fair  Psychomotor Activity:  Normal  Concentration:  Concentration: Fair  Recall:  of Knowledge:  Fair  Language:  Fair  Akathisia:  No  Handed:  Right  AIMS (if indicated):     Assets:  Desire for Improvement Housing Physical Health Resilience  ADL's:  Intact  Cognition:  WNL  Sleep:  Number of Hours: 8     Treatment Plan Summary: Daily contact with patient to assess and evaluate symptoms and progress in treatment, Medication management and Plan Patient and I discussed something that his attending psychiatrist brought up with me which is the feeling that lithium would be a very appropriate addition given his recurrent suicidal impulsiveness.  Patient and I once again discussed the side effects that he had had.  He said that they really only started when he went up to a higher dose of lithium.  He mainly remembers that he was unable to sleep.  I proposed to him that we try adding only 300 mg of lithium at night and just leave it at that and see if he can tolerate it.  Otherwise we will leave his current medicines as they are.  Patient and I will talk tomorrow and reassess where he is with his plan and see whether we should be planning for discharge tomorrow or after the weekend.  [employer], MD 01/27/2020, 3:27 PM

## 2020-01-27 NOTE — B[facility] Group Notes (Signed)
[facility] Group Notes:  (Nursing/MHT/Case Management/Adjunct)  Date:  01/27/2020  Time:  12:09 PM  Type of Therapy:  Psychoeducational Skills  Participation Level:  Active  Participation Quality:  Appropriate, Attentive, Sharing and Supportive  Affect:  Appropriate  Cognitive:  Alert and Appropriate  Insight:  Appropriate and Good  Engagement in Group:  Engaged  Modes of Intervention:  Activity, Education and Support  Summary of Progress/Problems:  Kerrie Pleasure 01/27/2020, 12:09 PM

## 2020-01-27 NOTE — B[facility] Group Notes (Signed)
[facility] Group Notes:  (Nursing/MHT/Case Management/Adjunct)  Date:  01/27/2020  Time:  10:40 PM  Type of Therapy:  Group Therapy  Participation Level:  Active  Participation Quality:  Appropriate and walk in group a little late not too late.  Affect:  Appropriate  Cognitive:  Alert  Insight:  Good  Engagement in Group:  Engaged and Didn't want snacks.  Modes of Intervention:  Support  Summary of Progress/Problems:  Joseph Aguilar 01/27/2020, 10:40 PM

## 2020-01-27 NOTE — Progress Notes (Signed)
Recreation Therapy Notes  Date: 01/27/2020  Time: 9:30 am  Location: Craft Room  Behavioral response: Appropriate   Intervention Topic: Happiness      Discussion/Intervention:  Group content today was focused on Happiness. The group defined happiness and described where happiness comes from. Individuals identified what makes them happy and how they go about making others happy. Patients expressed things that stop them from being happy and ways they can improve their happiness. The group stated reasons why it is important to be happy. The group participated in the intervention "My Happiness", where they had a chance to identify and express things that make them happy. Clinical Observations/Feedback:  Patient came to group and defined happiness as looking forward to everyday. He explained that going to supportive social groups makes him happy. Participant expressed that happiness influences your life. Individual was social with peers and staff while participating in the intervention.   Italo Banton LRT/CTRS         Crystalina Stodghill 01/27/2020 11:38 AM

## 2020-01-27 NOTE — B[facility] Group Notes (Addendum)
[facility] Group Notes:  (Nursing/MHT/Case Management/Adjunct)  Date:  01/27/2020  Time:  3:44 PM  Type of Therapy:  Communication Group  Participation Level:  Active  Participation Quality:  Appropriate, Attentive and Sharing  Affect:  Appropriate  Cognitive:  Alert, Appropriate and Oriented  Insight:  Appropriate  Engagement in Group:  Engaged  Modes of Intervention:  Activity and Discussion  Summary of Progress/Problems:  Joseph Aguilar 01/27/2020, 3:44 PM

## 2020-01-28 MED ORDER — LITHIUM CARBONATE ER 300 MG PO TBCR: 300.0000 mg | EXTENDED_RELEASE_TABLET | Freq: Two times a day (BID) | ORAL | 0 refills | Status: AC

## 2020-01-28 MED ORDER — ARIPIPRAZOLE 10 MG PO TABS: 10.0000 mg | ORAL_TABLET | Freq: Every day | ORAL | 0 refills | Status: AC

## 2020-01-28 MED ORDER — HYDROXYZINE HCL 50 MG PO TABS: 50.0000 mg | ORAL_TABLET | Freq: Four times a day (QID) | ORAL | 0 refills | Status: AC | PRN

## 2020-01-28 MED ORDER — TRAZODONE HCL 100 MG PO TABS: 100.0000 mg | ORAL_TABLET | Freq: Every day | ORAL | 0 refills | Status: AC

## 2020-01-28 MED ORDER — VENLAFAXINE HCL ER 75 MG PO CP24: 225.0000 mg | ORAL_CAPSULE | Freq: Every day | ORAL | 0 refills | Status: AC

## 2020-01-28 NOTE — Plan of Care (Signed)
Patient stated he is feeling better and is working on his coping skills  Problem: Coping: Goal: Coping ability will improve Outcome: Progressing

## 2020-01-28 NOTE — Progress Notes (Signed)
D- Patient alert and oriented. Patient presents in a depressed, but pleasant mood on assessment stating that he slept ok last night and had no major complaints to voice to this Clinical research associate. Patient reported that his anxiety was "not too bad" right now, however, he did endorse depression, stating that he has "a lot" going on. Patient reports that he has "fleeting" thoughts of SI, with no plan, ut he does feel safe on the unit and contracts for safety with this Clinical research associate. Patient also reported VH earlier this morning of "fog, but right now, it's ok". Patient denies SI, HI, AH, and pain at this time. Patient's goal for today is to "see if discharge can be today via discussion with doctor yesterday. No complications with Lithium last night".  A- Scheduled medications administered to patient, per MD orders. Support and encouragement provided.  Routine safety checks conducted every 15 minutes.  Patient informed to notify staff with problems or concerns.  R- No adverse drug reactions noted. Patient contracts for safety at this time. Patient compliant with medications and treatment plan. Patient receptive, calm, and cooperative. Patient interacts well with others on the unit.  Patient remains safe at this time.

## 2020-01-28 NOTE — BHH Suicide Risk Assessment (Signed)
[facility] Discharge Suicide Risk Assessment   Principal Problem: MDD (major depressive disorder), recurrent episode, severe (HCC) Discharge Diagnoses: Principal Problem:   MDD (major depressive disorder), recurrent episode, severe (HCC) Active Problems:   Self-inflicted laceration of left wrist (HCC)   Total Time spent with patient: 30 minutes  Musculoskeletal: Strength & Muscle Tone: within normal limits Gait & Station: normal Patient leans: N/A  Psychiatric Specialty Exam: Review of Systems  Constitutional: Negative.   HENT: Negative.   Eyes: Negative.   Respiratory: Negative.   Cardiovascular: Negative.   Gastrointestinal: Negative.   Musculoskeletal: Negative.   Skin: Negative.   Neurological: Negative.   Psychiatric/Behavioral: Negative.     Blood pressure 131/80, pulse 83, temperature 97.8 F (36.6 C), temperature source Oral, resp. rate 15, height 6\' 1"  (1.854 m), weight 64 kg, SpO2 100 %.Body mass index is 18.6 kg/m.  General Appearance: Casual  Eye Contact::  Fair  Speech:  Clear and Coherent409  Volume:  Normal  Mood:  Euthymic  Affect:  Constricted  Thought Process:  Coherent  Orientation:  Full (Time, Place, and Person)  Thought Content:  Logical  Suicidal Thoughts:  No  Homicidal Thoughts:  No  Memory:  Immediate;   Fair Recent;   Fair Remote;   Fair  Judgement:  Fair  Insight:  Fair  Psychomotor Activity:  Normal  Concentration:  Fair  Recall:  [id number] of Knowledge:Fair  Language: Fair  Akathisia:  No  Handed:  Right  AIMS (if indicated):     Assets:  Desire for Improvement Financial Resources/Insurance Housing Physical Health Resilience  Sleep:  Number of Hours: 7  Cognition: WNL  ADL's:  Intact   Mental Status Per Nursing Assessment::   On Admission:  Self-harm behaviors  Demographic Factors:  Male and Living alone  Loss Factors: NA  Historical Factors: Prior suicide attempts  Risk Reduction Factors:   Employed and Positive  therapeutic relationship  Continued Clinical Symptoms:  Depression:   Anhedonia  Cognitive Features That Contribute To Risk:  None    Suicide Risk:  Minimal: No identifiable suicidal ideation.  Patients presenting with no risk factors but with morbid ruminations; may be classified as minimal risk based on the severity of the depressive symptoms  Follow-up Information    [facility] Follow up.   Specialty: Behavioral Health Why: Appointment is 3/03-2020 at 11AM with Dr. [id number].  Dr. Montey Hora, psycologist/therapist will call to reschedule PTSD group and individual therapy, TBI appointment not scheduled yet. Thanks! Contact information: [address] [place] [phone]          Plan Of Care/Follow-up recommendations:  Activity:  Activity as tolerated Diet:  Regular diet Other:  Patient has follow-up already arranged at the [facility] [phone].  Prescriptions will be provided.  Patient is agreeing that if suicidal thoughts return he will make calls to providers or suicide hotlines or return to the hospital rather than acting on it.  [place], MD 01/28/2020, 12:22 PM

## 2020-01-28 NOTE — Progress Notes (Signed)
BRIEF PHARMACY NOTE  This patient attended and participated in Medication Management Group counseling led by [facility] staff pharmacist.  This interactive class reviews basic information about prescription medications and education on personal responsibility in medication management.  The class also includes general knowledge of 3 main classes of behavioral medications, including antipsychotics, antidepressants, and mood stabilizers.     Patient behavior was appropriate for group setting.   Educational materials sourced from:  "Medication Do's and Don'ts" from WWW.MED-PASS.COM   "Mental Health Medications" from National Institute of Mental Health Https://www.nimh.nih.gov/health/topics/mental-health-medications/index.shtml#part 149856    Taylen Wendland M Nalia Honeycutt, PharmD, BCPS Clinical Pharmacist 01/27/20 2:15pm 

## 2020-01-28 NOTE — Plan of Care (Signed)
  Problem: Leisure Education °Goal: STG - Patient will identify 3 healthy leisure activities they can access in the community post d/c within 5 recreation therapy group sessions °Description: STG - Patient will identify 3 healthy leisure activities they can access in the community post d/c within 5 recreation therapy group sessions °Outcome: Completed/Met °  °

## 2020-01-28 NOTE — Progress Notes (Signed)
Recreation Therapy Notes  INPATIENT RECREATION TR PLAN  Patient Details Name: Joseph Aguilar MRN: [id number] DOB: 04-30-1981 Today's Date: 01/28/2020  Rec Therapy Plan Is patient appropriate for Therapeutic Recreation?: Yes Treatment times per week: at least 3 Estimated Length of Stay: 5-7 days TR Treatment/Interventions: Group participation (Comment)  Discharge Criteria Pt will be discharged from therapy if:: Discharged Treatment plan/goals/alternatives discussed and agreed upon by:: Patient/family  Discharge Summary Short term goals set: Patient will identify 3 healthy leisure activities they can access in the community post d/c within 5 recreation therapy group sessions Short term goals met: Complete Progress toward goals comments: Groups attended Which groups?: Other (Comment)(Happiness, Self-care, Problem Solving) Reason goals not met: N/A Therapeutic equipment acquired: N/A Reason patient discharged from therapy: Discharge from hospital Pt/family agrees with progress & goals achieved: Yes Date patient discharged from therapy: 01/28/20   Gradyn Shein 01/28/2020, 2:35 PM

## 2020-01-28 NOTE — Progress Notes (Signed)
D - MDD  A - Patient was in his room upon arrival to the unit. Patient pleasant during assessment denying SI/HI/AVH and pain. Patient stated he has some anxiety (3/10) and depression (5/10). Patient said he liked that the unit was calm and quiet this evening, stating that was helping him feel less anxiety. Patient given education. Patient given support and encouragement to be active in his treatment plan. Patient informed to let staff know if there are any issues or problems on the unit.  R - Patient being monitored Q 15 minutes for safety per unit protocol. Patient remains safe on the unit.

## 2020-01-28 NOTE — Progress Notes (Signed)
Patient's dressing to left wrist was cleansed and changed during morning med pass.

## 2020-01-28 NOTE — Discharge Summary (Signed)
Physician Discharge Summary Note  Patient:  Joseph Aguilar is an 39 y.o., male MRN:  [id number] DOB:  Oct 24, 1981 Patient phone:  [phone] (home)  Patient address:   [address] [place] [address],  Total Time spent with patient: 30 minutes  Date of Admission:  01/26/2020 Date of Discharge: January 28, 2020  Reason for Admission: Patient was admitted for treatment of depression after self-inflicted injury to his wrist with suicidal ideation.  Principal Problem: MDD (major depressive disorder), recurrent episode, severe ([place]) Discharge Diagnoses: Principal Problem:   MDD (major depressive disorder), recurrent episode, severe ([place]) Active Problems:   Self-inflicted laceration of left wrist [facility])   Past Psychiatric History: Patient has a history of severe depression.  He was recently discharged from the [facility] where he had had a hospitalization that lasted over 2 months for treatment of depression.  During that hospitalization he had complications from ECT and poor tolerance of some medicine.  He has outpatient follow-up with therapy and medication management arranged through the [facility].  He does have a past history of suicidality but no history of mania or definite psychosis.  Past Medical History:  Past Medical History:  Diagnosis Date  . PTSD (post-traumatic stress disorder)   . Severe depression ([place])     Past Surgical History:  Procedure Laterality Date  . WISDOM TOOTH EXTRACTION     Family History: History reviewed. No pertinent family history. Family Psychiatric  History: Patient has a negative past history see previous notes. Social History:  Social History   Substance and Sexual Activity  Alcohol Use Yes   Comment: social     Social History   Substance and Sexual Activity  Drug Use Never    Social History   Socioeconomic History  . Marital status: Single    Spouse name: Not on file  . Number of children: Not on file  . Years of education: Not on  file  . Highest education level: Not on file  Occupational History  . Not on file  Tobacco Use  . Smoking status: Never Smoker  . Smokeless tobacco: Never Used  Substance and Sexual Activity  . Alcohol use: Yes    Comment: social  . Drug use: Never  . Sexual activity: Not on file  Other Topics Concern  . Not on file  Social History Narrative  . Not on file   Social Determinants of Health   Financial Resource Strain:   . Difficulty of Paying Living Expenses: Not on file  Food Insecurity:   . Worried About Charity fundraiser in the Last Year: Not on file  . Ran Out of Food in the Last Year: Not on file  Transportation Needs:   . Lack of Transportation (Medical): Not on file  . Lack of Transportation (Non-Medical): Not on file  Physical Activity:   . Days of Exercise per Week: Not on file  . Minutes of Exercise per Session: Not on file  Stress:   . Feeling of Stress : Not on file  Social Connections:   . Frequency of Communication with Friends and Family: Not on file  . Frequency of Social Gatherings with Friends and Family: Not on file  . Attends Religious Services: Not on file  . Active Member of Clubs or Organizations: Not on file  . Attends Archivist Meetings: Not on file  . Marital Status: Not on file    Hospital Course: In the hospital the patient was cooperative and pleasant  throughout his time here.  Showed good insight and was forthcoming about symptoms.  Participated in groups appropriately.  We made some medication adjustments.  Dose of Effexor was increased to 225 mg/day which he has tolerated without any complaints or complications.  After talking with his outpatient psychiatrist I suggested to the patient that we retry lithium at a modest dose of 300 mg twice a day.  He has tolerated this so far without any difficulty or complaints.  No sign so far of any lithium toxicity.  On discussion today the patient feels that he would be safe to go home.  He has  no firearms at home and is trying to minimize the risk of any dangerous objects around the house.  He is strongly encouraged not to consume any alcohol at all.  He is agreeable to getting in touch with help immediately prior to any act to harm himself.  At this point there is no clear rationale for keeping him in the hospital through the weekend.  He is very aware of how to access treatment and has good insight and motivation.  Discharge home today with follow-up at the [facility].  Physical Findings: AIMS: Facial and Oral Movements Muscles of Facial Expression: None, normal Lips and Perioral Area: None, normal Jaw: None, normal Tongue: None, normal,Extremity Movements Upper (arms, wrists, hands, fingers): None, normal Lower (legs, knees, ankles, toes): None, normal, Trunk Movements Neck, shoulders, hips: None, normal, Overall Severity Severity of abnormal movements (highest score from questions above): None, normal Incapacitation due to abnormal movements: None, normal Patient's awareness of abnormal movements (rate only patient's report): No Awareness, Dental Status Current problems with teeth and/or dentures?: No Does patient usually wear dentures?: No  CIWA:  CIWA-Ar Total: 0 COWS:  COWS Total Score: 0  Musculoskeletal: Strength & Muscle Tone: within normal limits Gait & Station: normal Patient leans: N/A  Psychiatric Specialty Exam: Physical Exam  Nursing note and vitals reviewed. Constitutional: He appears well-developed and well-nourished.  HENT:  Head: Normocephalic and atraumatic.  Eyes: Pupils are equal, round, and reactive to light. Conjunctivae are normal.  Cardiovascular: Regular rhythm and normal heart sounds.  Respiratory: Effort normal. No respiratory distress.  GI: Soft.  Musculoskeletal:        General: Normal range of motion.     Cervical back: Normal range of motion.  Neurological: He is alert.  Skin: Skin is warm and dry.  Psychiatric: He has a normal mood and  affect. His speech is normal and behavior is normal. Judgment normal. His affect is not blunt. His speech is not delayed. He is not slowed. Thought content is not paranoid. Cognition and memory are normal. He expresses no homicidal and no suicidal ideation.    Review of Systems  Constitutional: Negative.   HENT: Negative.   Eyes: Negative.   Respiratory: Negative.   Cardiovascular: Negative.   Gastrointestinal: Negative.   Musculoskeletal: Negative.   Skin: Negative.   Neurological: Negative.   Psychiatric/Behavioral: Negative.     Blood pressure 131/80, pulse 83, temperature 97.8 F (36.6 C), temperature source Oral, resp. rate 15, height 6\' 1"  (1.854 m), weight 64 kg, SpO2 100 %.Body mass index is 18.6 kg/m.  General Appearance: Casual  Eye Contact:  Good  Speech:  Clear and Coherent  Volume:  Normal  Mood:  Euthymic  Affect:  Congruent  Thought Process:  Goal Directed  Orientation:  Full (Time, Place, and Person)  Thought Content:  Logical  Suicidal Thoughts:  No  Homicidal Thoughts:  No  Memory:  Immediate;   Fair Recent;   Fair Remote;   Fair  Judgement:  Fair  Insight:  Fair  Psychomotor Activity:  Normal  Concentration:  Concentration: Fair  Recall:  [employer] of Knowledge:  Fair  Language:  Fair  Akathisia:  No  Handed:  Right  AIMS (if indicated):     Assets:  Desire for Improvement Housing Physical Health Resilience  ADL's:  Intact  Cognition:  WNL  Sleep:  Number of Hours: 7     Have you used any form of tobacco in the last 30 days? (Cigarettes, Smokeless Tobacco, Cigars, and/or Pipes): No  Has this patient used any form of tobacco in the last 30 days? (Cigarettes, Smokeless Tobacco, Cigars, and/or Pipes) Yes, No  Blood Alcohol level:  Lab Results  Component Value Date   ETH 26 (H) 01/25/2020    Metabolic Disorder Labs:  No results found for: HGBA1C, MPG No results found for: PROLACTIN No results found for: CHOL, TRIG, HDL, CHOLHDL, VLDL,  LDLCALC  See Psychiatric Specialty Exam and Suicide Risk Assessment completed by Attending Physician prior to discharge.  Discharge destination:  Home  Is patient on multiple antipsychotic therapies at discharge:  No   Has Patient had three or more failed trials of antipsychotic monotherapy by history:  No  Recommended Plan for Multiple Antipsychotic Therapies: NA  Discharge Instructions    Diet - low sodium heart healthy   Complete by: As directed    Increase activity slowly   Complete by: As directed      Allergies as of 01/28/2020      Reactions   Amoxicillin Rash      Medication List    TAKE these medications     Indication  ARIPiprazole 10 MG tablet Commonly known as: ABILIFY Take 1 tablet (10 mg total) by mouth daily. Start taking on: January 29, 2020  Indication: Major Depressive Disorder   hydrOXYzine 50 MG tablet Commonly known as: ATARAX/VISTARIL Take 1 tablet (50 mg total) by mouth every 6 (six) hours as needed for anxiety.  Indication: Feeling Anxious   lithium carbonate 300 MG CR tablet Commonly known as: LITHOBID Take 1 tablet (300 mg total) by mouth every 12 (twelve) hours.  Indication: Major Depressive Disorder   traZODone 100 MG tablet Commonly known as: DESYREL Take 1 tablet (100 mg total) by mouth at bedtime.  Indication: Trouble Sleeping   venlafaxine XR 75 MG 24 hr capsule Commonly known as: EFFEXOR-XR Take 3 capsules (225 mg total) by mouth daily with breakfast. Start taking on: January 29, 2020  Indication: Major Depressive Disorder      Follow-up Information    [facility] Follow up.   Specialty: Behavioral Health Why: Appointment is 3/03-2020 at 11AM with Dr. Montey Hora.  Dr. Sharlot Gowda, psycologist/therapist will call to reschedule PTSD group and individual therapy, TBI appointment not scheduled yet. Thanks! Contact information: [address] [place] [address] [phone]          Follow-up  recommendations:  Activity:  Activity as tolerated Diet:  Regular diet Other:  Prescriptions given at discharge.  [facility]  Comments: Patient will be given prescriptions.  Medicines reviewed.  Follow-up with outpatient therapy and medicine management.  Signed: Mordecai Rasmussen, MD 01/28/2020, 12:27 PM

## 2020-01-28 NOTE — BHH Group Notes (Signed)
LCSW Group Therapy Note  01/28/2020 1:00 PM  Type of Therapy and Topic:  Group Therapy:  Feelings around Relapse and Recovery  Participation Level:  Did Not Attend   Description of Group:    Patients in this group will discuss emotions they experience before and after a relapse. They will process how experiencing these feelings, or avoidance of experiencing them, relates to having a relapse. Facilitator will guide patients to explore emotions they have related to recovery. Patients will be encouraged to process which emotions are more powerful. They will be guided to discuss the emotional reaction significant others in their lives may have to their relapse or recovery. Patients will be assisted in exploring ways to respond to the emotions of others without this contributing to a relapse.  Therapeutic Goals: 1. Patient will identify two or more emotions that lead to a relapse for them 2. Patient will identify two emotions that result when they relapse 3. Patient will identify two emotions related to recovery 4. Patient will demonstrate ability to communicate their needs through discussion and/or role plays   Summary of Patient Progress: X  Therapeutic Modalities:   Cognitive Behavioral Therapy Solution-Focused Therapy Assertiveness Training Relapse Prevention Therapy   Mikaiah Stoffer, MSW, LCSW 01/28/2020 10:44 AM 

## 2020-01-28 NOTE — Progress Notes (Signed)
Recreation Therapy Notes  Date: 01/28/2020  Time: 9:30 am  Location: Craft Room  Behavioral response: Appropriate   Intervention Topic: Self-care     Discussion/Intervention:  Group content today was focused on Self-Care. The group defined self-care and some positive ways they care for themselves. Individuals expressed ways and reasons why they neglected any self-care in the past. Patients described ways to improve self-care in the future. The group explained what could happen if they did not do any self-care activities at all. The group participated in the intervention "self-care assessment" where they had a chance to discover some of their weaknesses and strengths in self- care. Patient came up with a self-care plan to improve themselves in the future.  Clinical Observations/Feedback:  Patient came to group and explained that he participates in self-care by getting sleep, eating right and getting vitamin D. He stated that sometime he neglects self-care because someday's it is hard to even get out of bed. Participant express that self-care can be affected by self- medicating. Patient explained that self-care is important because it can help you feel better overall. Individual was social with peers and staff while participating in the intervention.   Ninetta Adelstein LRT/CTRS         Cobi Aldape 01/28/2020 11:34 AM

## 2020-01-28 NOTE — Progress Notes (Signed)
Patient ID: Joseph Aguilar, male   DOB: 08/09/1981, 39 y.o.   MRN: [id number]   Discharge Note:  Patient denies SI/HI/AVH at this time. Discharge instructions, AVS, prescriptions, and transition record gone over with patient. Patient agrees to comply with medication management, follow-up visit, and outpatient therapy. Patient belongings returned to patient. Patient questions and concerns addressed and answered. Patient ambulatory off unit. Patient discharged to home, via [employer], transportation services.

## 2021-03-18 IMAGING — DX DG WRIST COMPLETE 3+V*L*
4 series · 4 of 4 positions shown · non-contrast
Comparison: None.

CLINICAL DATA: Pain

EXAM:
LEFT WRIST - COMPLETE 3+ VIEW

[wrist ap (1 of 2)]
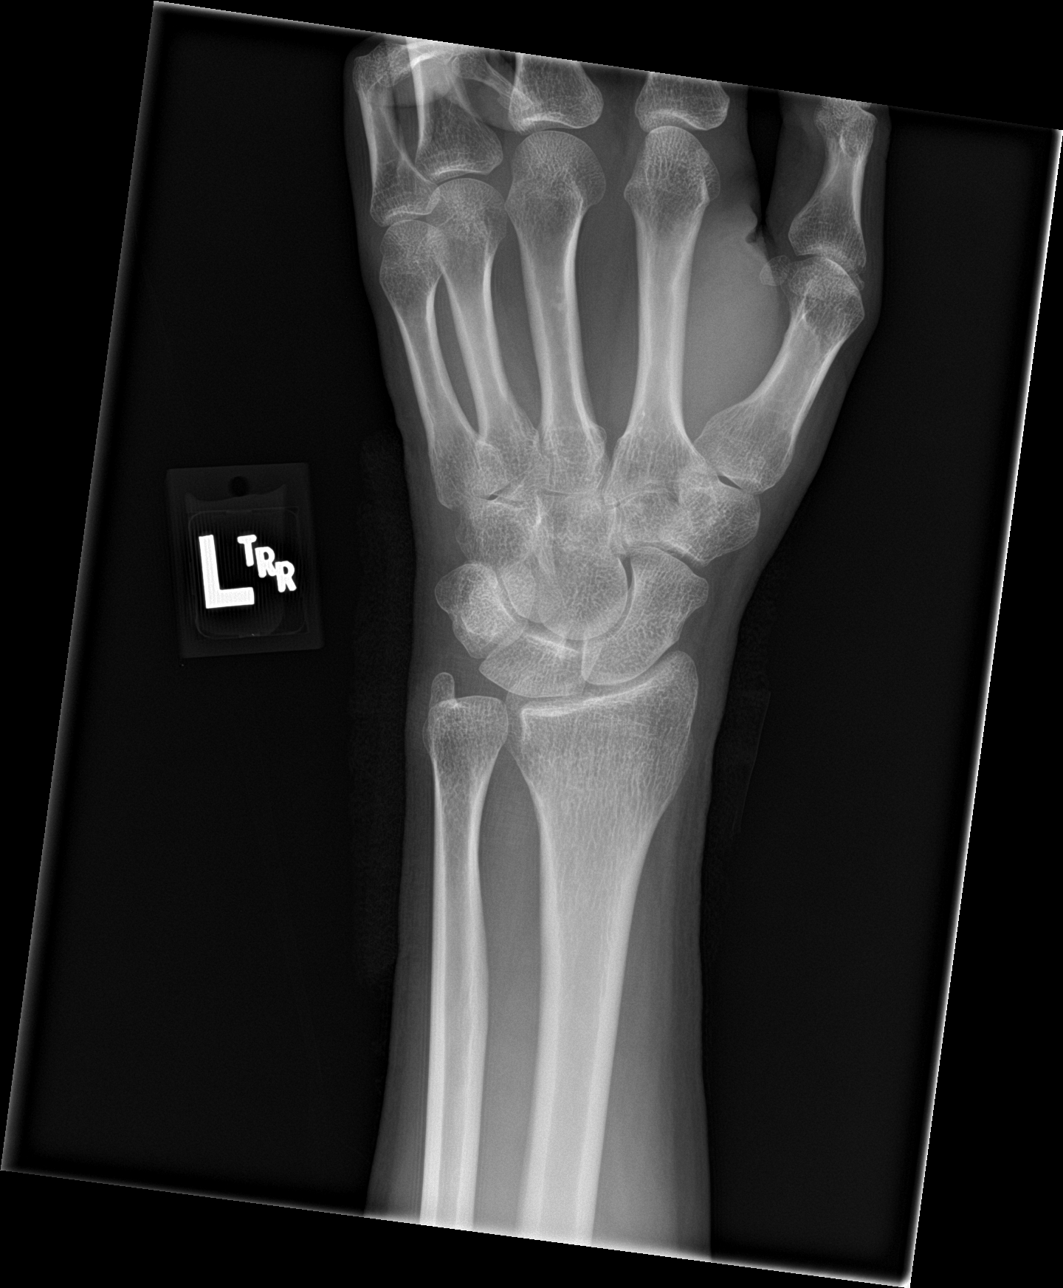

[wrist obl]
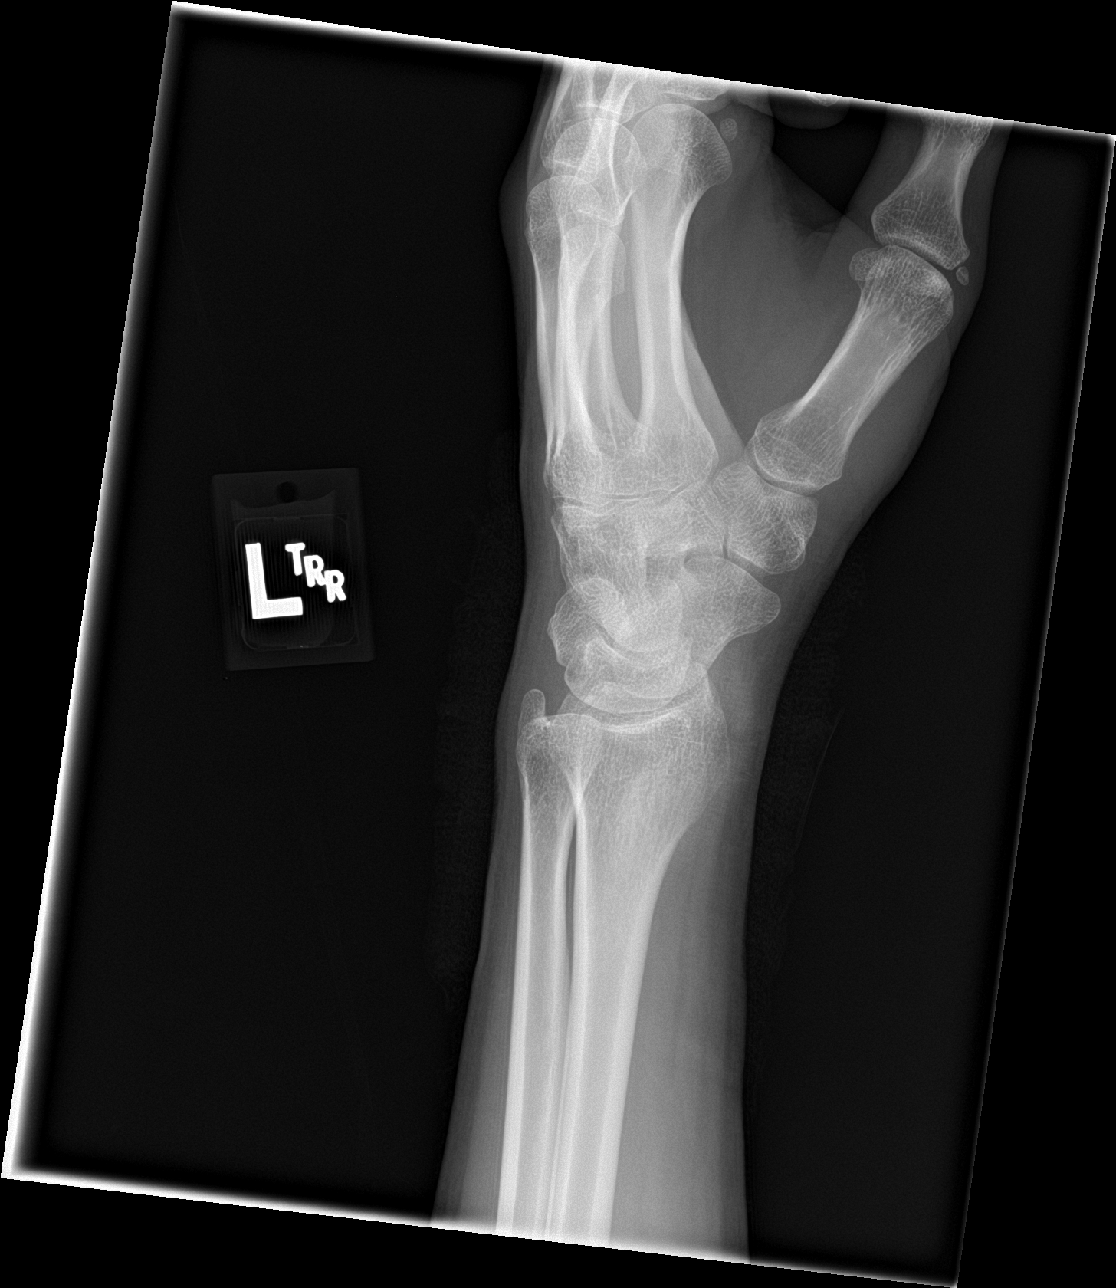

[wrist lat]
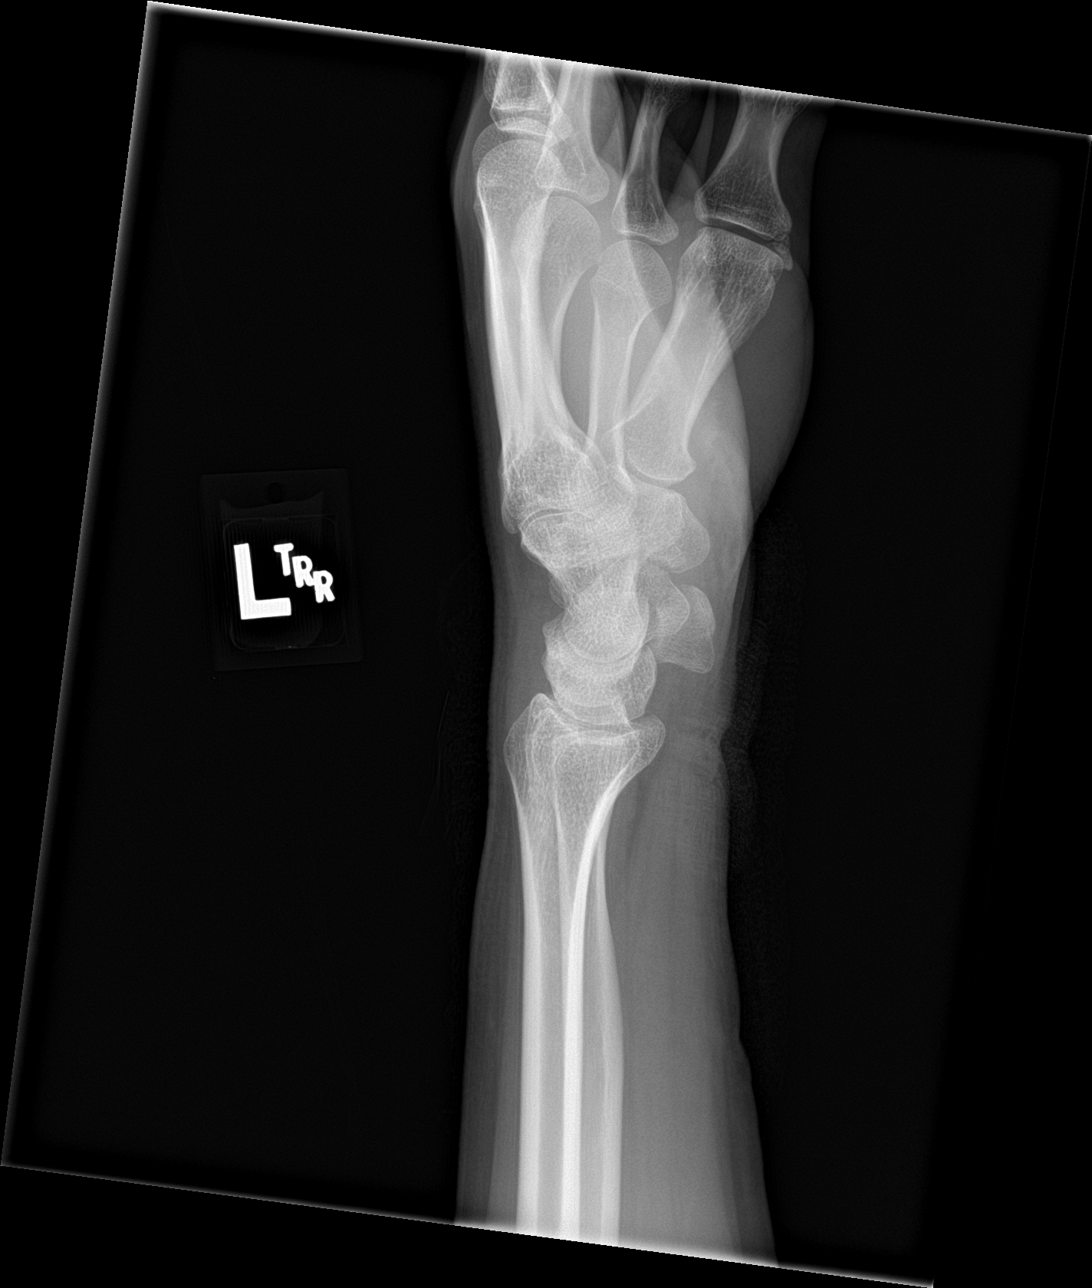

[wrist ap (2 of 2)]
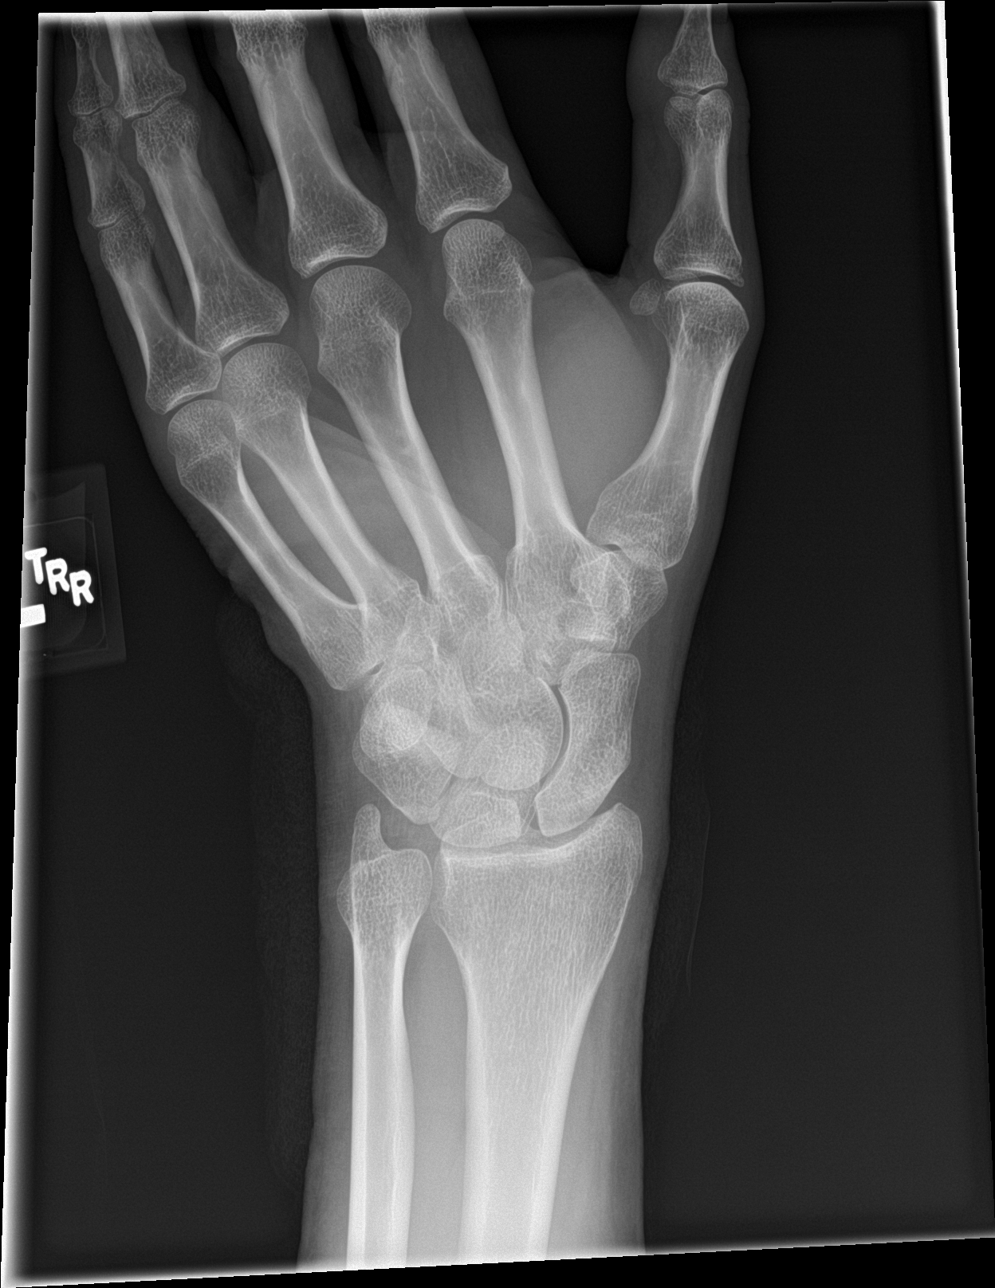

[4 of 4 positions shown; findings below may reference images not displayed]

FINDINGS: There is soft tissue swelling about the wrist with multiple
lacerations involving the volar aspect of the wrist. There is no
radiopaque foreign body. No acute displaced fracture or dislocation.
IMPRESSION: 1. No acute osseous abnormality.
2. No radiopaque foreign body.
